# Patient Record
Sex: Female | Born: 1955 | Race: White | Hispanic: No | Marital: Married | Smoking: Current every day smoker
Health system: Southern US, Community
[De-identification: ages and names within clinical notes are randomized; demographics above are authoritative.]

## PROBLEM LIST (undated history)

## (undated) DIAGNOSIS — F329 Major depressive disorder, single episode, unspecified: Secondary | ICD-10-CM

## (undated) DIAGNOSIS — F419 Anxiety disorder, unspecified: Secondary | ICD-10-CM

## (undated) DIAGNOSIS — R569 Unspecified convulsions: Secondary | ICD-10-CM

## (undated) DIAGNOSIS — G2581 Restless legs syndrome: Secondary | ICD-10-CM

## (undated) DIAGNOSIS — G43909 Migraine, unspecified, not intractable, without status migrainosus: Secondary | ICD-10-CM

## (undated) DIAGNOSIS — I10 Essential (primary) hypertension: Secondary | ICD-10-CM

## (undated) DIAGNOSIS — F32A Depression, unspecified: Secondary | ICD-10-CM

## (undated) DIAGNOSIS — G40909 Epilepsy, unspecified, not intractable, without status epilepticus: Secondary | ICD-10-CM

## (undated) DIAGNOSIS — M1990 Unspecified osteoarthritis, unspecified site: Secondary | ICD-10-CM

## (undated) DIAGNOSIS — K219 Gastro-esophageal reflux disease without esophagitis: Secondary | ICD-10-CM

## (undated) DIAGNOSIS — J449 Chronic obstructive pulmonary disease, unspecified: Secondary | ICD-10-CM

## (undated) DIAGNOSIS — R42 Dizziness and giddiness: Secondary | ICD-10-CM

## (undated) DIAGNOSIS — F1220 Cannabis dependence, uncomplicated: Secondary | ICD-10-CM

## (undated) DIAGNOSIS — E669 Obesity, unspecified: Secondary | ICD-10-CM

## (undated) DIAGNOSIS — G4733 Obstructive sleep apnea (adult) (pediatric): Secondary | ICD-10-CM

## (undated) HISTORY — DX: Obstructive sleep apnea (adult) (pediatric): G47.33

## (undated) HISTORY — DX: Depression, unspecified: F32.A

## (undated) HISTORY — DX: Epilepsy, unspecified, not intractable, without status epilepticus: G40.909

## (undated) HISTORY — DX: Restless legs syndrome: G25.81

## (undated) HISTORY — DX: Cannabis dependence, uncomplicated: F12.20

## (undated) HISTORY — DX: Dizziness and giddiness: R42

## (undated) HISTORY — DX: Migraine, unspecified, not intractable, without status migrainosus: G43.909

## (undated) HISTORY — DX: Anxiety disorder, unspecified: F41.9

## (undated) HISTORY — PX: OTHER SURGICAL HISTORY: SHX169

## (undated) HISTORY — DX: Unspecified osteoarthritis, unspecified site: M19.90

## (undated) HISTORY — DX: Obesity, unspecified: E66.9

## (undated) HISTORY — PX: PARTIAL HYSTERECTOMY: SHX80

## (undated) HISTORY — DX: Gastro-esophageal reflux disease without esophagitis: K21.9

## (undated) HISTORY — DX: Unspecified convulsions: R56.9

## (undated) HISTORY — DX: Major depressive disorder, single episode, unspecified: F32.9

---

## 2002-08-30 HISTORY — PX: ROUX-EN-Y PROCEDURE: SUR1287

## 2006-11-14 ENCOUNTER — Emergency Department (HOSPITAL_COMMUNITY): Admission: EM | Admit: 2006-11-14 | Discharge: 2006-11-14 | Payer: Self-pay | Admitting: Emergency Medicine

## 2007-01-22 ENCOUNTER — Emergency Department (HOSPITAL_COMMUNITY): Admission: EM | Admit: 2007-01-22 | Discharge: 2007-01-22 | Payer: Self-pay | Admitting: Emergency Medicine

## 2007-02-13 ENCOUNTER — Emergency Department (HOSPITAL_COMMUNITY): Admission: EM | Admit: 2007-02-13 | Discharge: 2007-02-13 | Payer: Self-pay | Admitting: Emergency Medicine

## 2007-03-09 ENCOUNTER — Emergency Department (HOSPITAL_COMMUNITY): Admission: EM | Admit: 2007-03-09 | Discharge: 2007-03-09 | Payer: Self-pay | Admitting: Emergency Medicine

## 2007-03-09 ENCOUNTER — Ambulatory Visit: Payer: Self-pay | Admitting: Psychiatry

## 2007-03-09 ENCOUNTER — Inpatient Hospital Stay (HOSPITAL_COMMUNITY): Admission: AD | Admit: 2007-03-09 | Discharge: 2007-03-13 | Payer: Self-pay | Admitting: Psychiatry

## 2007-08-25 ENCOUNTER — Emergency Department (HOSPITAL_COMMUNITY): Admission: EM | Admit: 2007-08-25 | Discharge: 2007-08-25 | Payer: Self-pay | Admitting: Emergency Medicine

## 2007-09-26 ENCOUNTER — Emergency Department (HOSPITAL_COMMUNITY): Admission: EM | Admit: 2007-09-26 | Discharge: 2007-09-26 | Payer: Self-pay | Admitting: Emergency Medicine

## 2008-04-16 ENCOUNTER — Ambulatory Visit: Payer: Self-pay | Admitting: Orthopedic Surgery

## 2008-05-05 ENCOUNTER — Emergency Department: Payer: Self-pay | Admitting: Emergency Medicine

## 2008-07-15 ENCOUNTER — Emergency Department: Payer: Self-pay | Admitting: Emergency Medicine

## 2009-02-02 ENCOUNTER — Emergency Department (HOSPITAL_COMMUNITY): Admission: EM | Admit: 2009-02-02 | Discharge: 2009-02-02 | Payer: Self-pay | Admitting: Emergency Medicine

## 2009-04-14 IMAGING — CR DG CHEST 2V
1 series · 2 of 2 positions shown · non-contrast
Comparison: none

REASON FOR EXAM: cough and sob
COMMENTS:

[Series 1: view not recorded · 0.17mm/px · 2 of 2 slices shown]
[im 1/2]
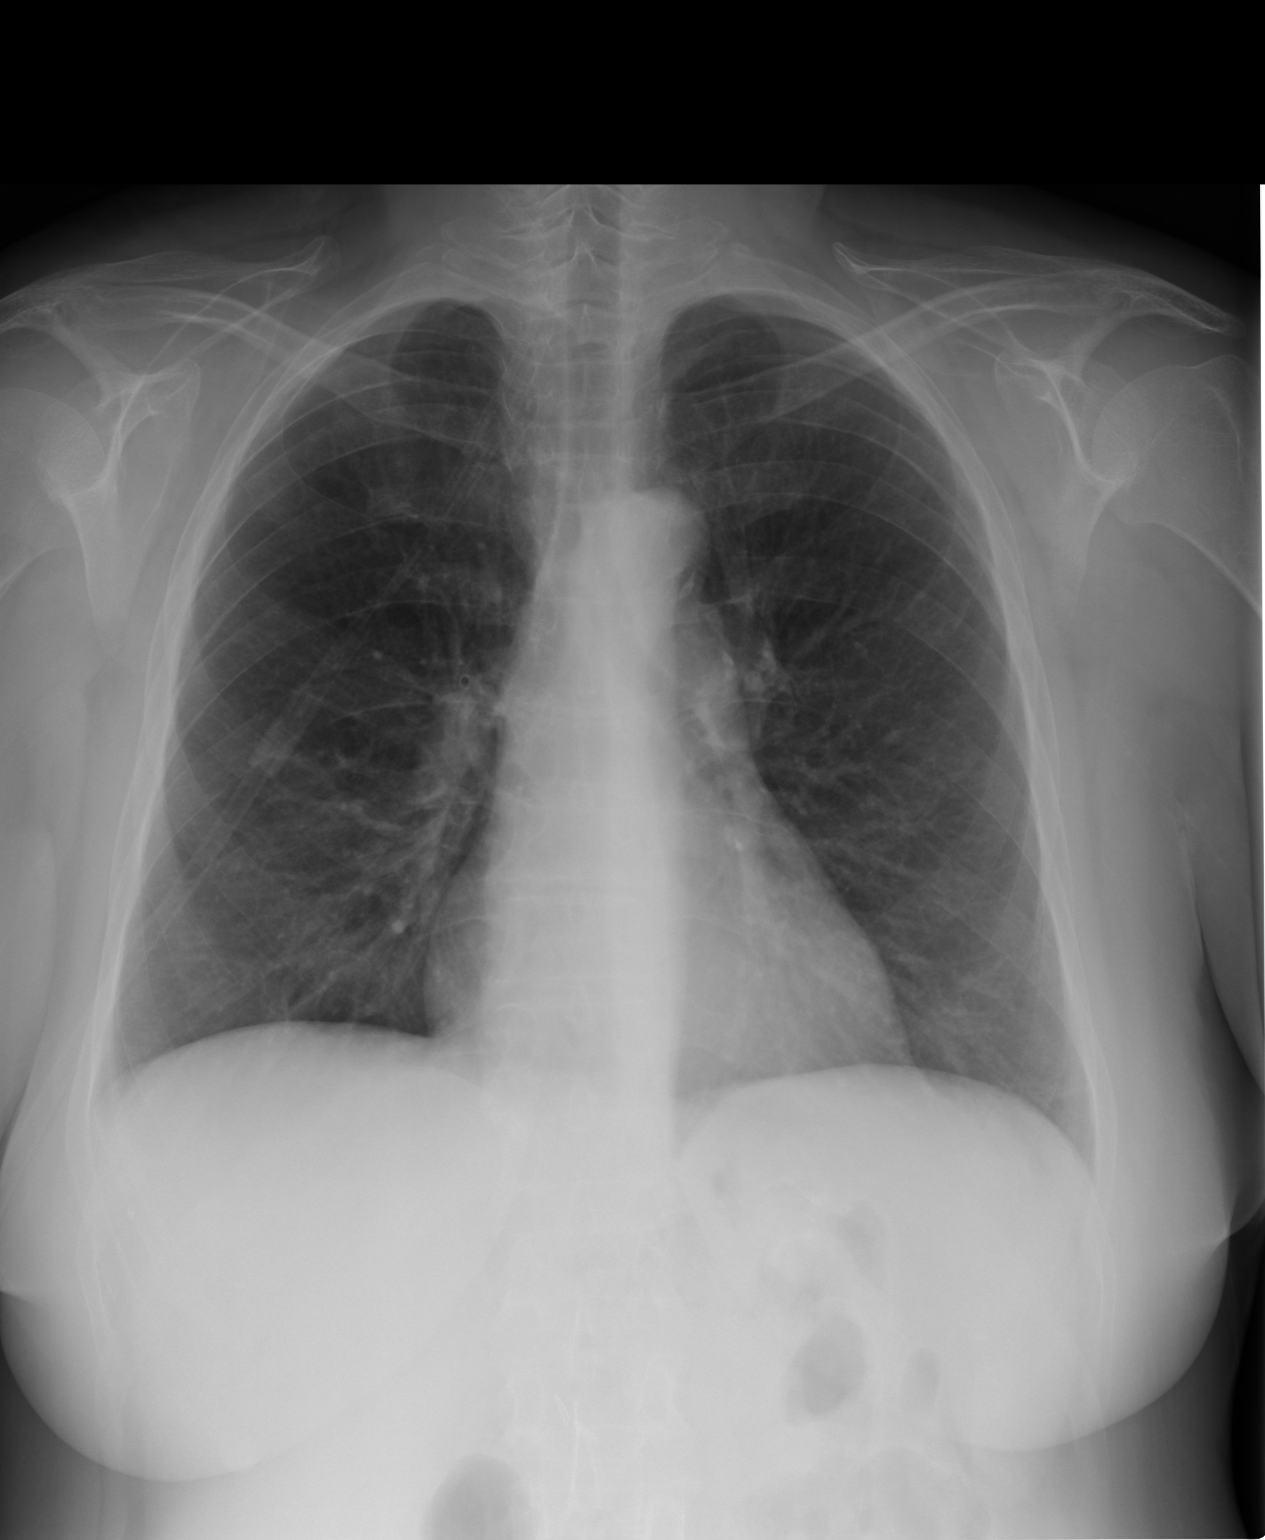
[im 2/2]
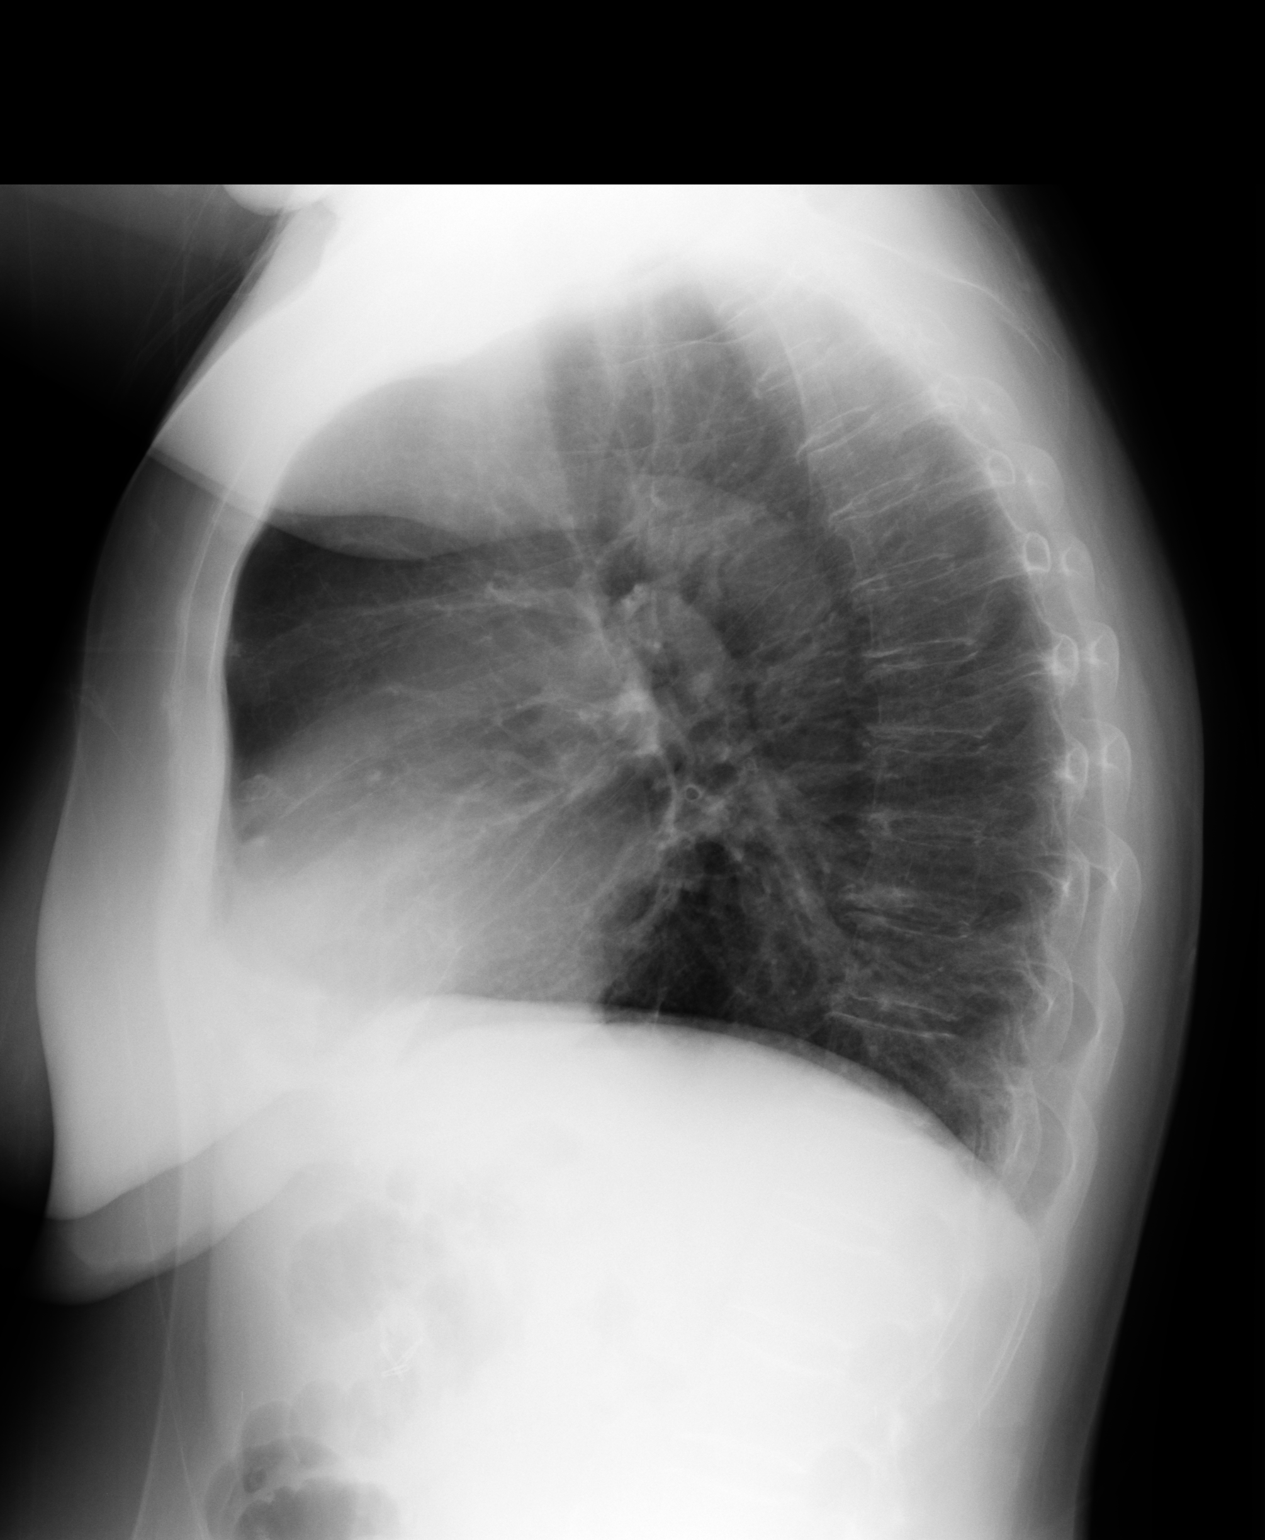

[2 of 2 positions shown; findings below may reference images not displayed]

PROCEDURE:     DXR - DXR CHEST PA (OR AP) AND LATERAL  - May 05, 2008  [DATE]

RESULT:     There is no previous examination for comparison.

The lungs are clear. The heart and pulmonary vessels are normal. The bony
and mediastinal structures are unremarkable. There is no effusion. There is
no pneumothorax or evidence of congestive failure.
IMPRESSION: No acute cardiopulmonary disease.

## 2009-07-03 ENCOUNTER — Ambulatory Visit: Payer: Self-pay | Admitting: Internal Medicine

## 2010-04-10 ENCOUNTER — Observation Stay (HOSPITAL_COMMUNITY): Admission: EM | Admit: 2010-04-10 | Discharge: 2010-04-10 | Payer: Self-pay | Admitting: Emergency Medicine

## 2010-04-15 ENCOUNTER — Emergency Department (HOSPITAL_COMMUNITY): Admission: EM | Admit: 2010-04-15 | Discharge: 2010-04-15 | Payer: Self-pay | Admitting: Emergency Medicine

## 2010-04-30 ENCOUNTER — Ambulatory Visit: Payer: Self-pay | Admitting: Family Medicine

## 2010-05-28 ENCOUNTER — Ambulatory Visit (HOSPITAL_COMMUNITY): Payer: Self-pay | Admitting: Psychiatry

## 2010-10-23 ENCOUNTER — Emergency Department (HOSPITAL_COMMUNITY)
Admission: EM | Admit: 2010-10-23 | Discharge: 2010-10-24 | Disposition: A | Discharge status: Home / Self Care | Payer: Medicare Other | Attending: Emergency Medicine | Admitting: Emergency Medicine

## 2010-10-23 DIAGNOSIS — G40909 Epilepsy, unspecified, not intractable, without status epilepticus: Secondary | ICD-10-CM | POA: Insufficient documentation

## 2010-10-23 DIAGNOSIS — Z79899 Other long term (current) drug therapy: Secondary | ICD-10-CM | POA: Insufficient documentation

## 2010-10-23 DIAGNOSIS — F319 Bipolar disorder, unspecified: Secondary | ICD-10-CM | POA: Insufficient documentation

## 2010-10-23 DIAGNOSIS — G43909 Migraine, unspecified, not intractable, without status migrainosus: Secondary | ICD-10-CM | POA: Insufficient documentation

## 2010-10-23 DIAGNOSIS — Z882 Allergy status to sulfonamides status: Secondary | ICD-10-CM | POA: Insufficient documentation

## 2010-10-23 DIAGNOSIS — J45909 Unspecified asthma, uncomplicated: Secondary | ICD-10-CM | POA: Insufficient documentation

## 2010-10-23 DIAGNOSIS — F17200 Nicotine dependence, unspecified, uncomplicated: Secondary | ICD-10-CM | POA: Insufficient documentation

## 2010-10-23 DIAGNOSIS — K5289 Other specified noninfective gastroenteritis and colitis: Secondary | ICD-10-CM | POA: Insufficient documentation

## 2010-10-23 LAB — BASIC METABOLIC PANEL
BUN: 9 mg/dL (ref 6–23)
Calcium: 9.9 mg/dL (ref 8.4–10.5)
Chloride: 103 mEq/L (ref 96–112)
Potassium: 3.9 mEq/L (ref 3.5–5.1)

## 2010-10-23 LAB — URINALYSIS, ROUTINE W REFLEX MICROSCOPIC
Ketones, ur: >80 mg/dL — AB
Nitrite: NEGATIVE

## 2010-10-23 LAB — URINE MICROSCOPIC-ADD ON

## 2010-10-23 LAB — DIFFERENTIAL
Basophils Relative: 0 % (ref 0–1)
Lymphocytes Relative: 16 % (ref 12–46)
Monocytes Relative: 3 % (ref 3–12)
Neutrophils Relative %: 80 % — ABNORMAL HIGH (ref 43–77)

## 2010-10-23 LAB — CBC
HCT: 45.8 % (ref 36.0–46.0)
Hemoglobin: 15.3 g/dL — ABNORMAL HIGH (ref 12.0–15.0)
MCH: 32 pg (ref 26.0–34.0)
MCHC: 33.4 g/dL (ref 30.0–36.0)
RDW: 12.9 % (ref 11.5–15.5)
WBC: 9.1 10*3/uL (ref 4.0–10.5)

## 2010-11-13 LAB — POCT I-STAT, CHEM 8
BUN: 4 mg/dL — ABNORMAL LOW (ref 6–23)
Chloride: 104 mEq/L (ref 96–112)
Creatinine, Ser: 0.7 mg/dL (ref 0.4–1.2)
HCT: 45 % (ref 36.0–46.0)
Hemoglobin: 15.3 g/dL — ABNORMAL HIGH (ref 12.0–15.0)
Sodium: 138 mEq/L (ref 135–145)

## 2010-11-13 LAB — CBC
HCT: 42.8 % (ref 36.0–46.0)
Hemoglobin: 14.2 g/dL (ref 12.0–15.0)
MCV: 96.8 fL (ref 78.0–100.0)
Platelets: 235 10*3/uL (ref 150–400)
RBC: 4.42 MIL/uL (ref 3.87–5.11)

## 2010-11-13 LAB — DIFFERENTIAL
Basophils Relative: 0 % (ref 0–1)
Eosinophils Relative: 0 % (ref 0–5)
Lymphs Abs: 1.5 10*3/uL (ref 0.7–4.0)
Monocytes Absolute: 0.2 10*3/uL (ref 0.1–1.0)

## 2010-11-18 ENCOUNTER — Encounter (INDEPENDENT_AMBULATORY_CARE_PROVIDER_SITE_OTHER): Payer: Self-pay | Admitting: *Deleted

## 2010-11-18 LAB — CONVERTED CEMR LAB
ALT: <8 units/L (ref 0–35)
AST: 11 units/L (ref 0–37)
Albumin: 4.4 g/dL (ref 3.5–5.2)
Basophils Relative: 0 % (ref 0–1)
CO2: 28 meq/L (ref 19–32)
Calcium: 9.2 mg/dL (ref 8.4–10.5)
Chloride: 101 meq/L (ref 96–112)
Hemoglobin: 13.7 g/dL (ref 12.0–15.0)
MCHC: 32.8 g/dL (ref 30.0–36.0)
MCV: 99.3 fL (ref 78.0–100.0)
Monocytes Relative: 7 % (ref 3–12)
Neutrophils Relative %: 53 % (ref 43–77)
Phenytoin Lvl: 2.9 ug/mL — ABNORMAL LOW (ref 10.0–20.0)
Platelets: 283 10*3/uL (ref 150–400)
RBC: 4.21 M/uL (ref 3.87–5.11)
Sodium: 139 meq/L (ref 135–145)
Total Protein: 6.6 g/dL (ref 6.0–8.3)

## 2010-12-07 LAB — DIFFERENTIAL
Basophils Absolute: 0 10*3/uL (ref 0.0–0.1)
Basophils Relative: 0 % (ref 0–1)
Eosinophils Absolute: 0.2 10*3/uL (ref 0.0–0.7)
Lymphocytes Relative: 22 % (ref 12–46)
Lymphs Abs: 1.7 10*3/uL (ref 0.7–4.0)
Monocytes Relative: 8 % (ref 3–12)
Neutro Abs: 5.2 10*3/uL (ref 1.7–7.7)
Neutrophils Relative %: 68 % (ref 43–77)

## 2010-12-07 LAB — CBC
HCT: 42.5 % (ref 36.0–46.0)
Hemoglobin: 15.1 g/dL — ABNORMAL HIGH (ref 12.0–15.0)
MCHC: 35.5 g/dL (ref 30.0–36.0)
WBC: 7.7 10*3/uL (ref 4.0–10.5)

## 2010-12-07 LAB — COMPREHENSIVE METABOLIC PANEL
ALT: 9 U/L (ref 0–35)
Albumin: 4 g/dL (ref 3.5–5.2)
Alkaline Phosphatase: 103 U/L (ref 39–117)
CO2: 27 mEq/L (ref 19–32)
Calcium: 9 mg/dL (ref 8.4–10.5)
Chloride: 103 mEq/L (ref 96–112)
GFR calc non Af Amer: >60 mL/min (ref >60)
Glucose, Bld: 112 mg/dL — ABNORMAL HIGH (ref 70–99)
Sodium: 138 mEq/L (ref 135–145)
Total Protein: 6.6 g/dL (ref 6.0–8.3)

## 2010-12-07 LAB — URINALYSIS, ROUTINE W REFLEX MICROSCOPIC
Bilirubin Urine: NEGATIVE
Glucose, UA: NEGATIVE mg/dL
Ketones, ur: NEGATIVE mg/dL
Nitrite: NEGATIVE

## 2010-12-07 LAB — URINE MICROSCOPIC-ADD ON

## 2010-12-13 ENCOUNTER — Emergency Department (HOSPITAL_COMMUNITY): Payer: Medicare Other

## 2010-12-13 ENCOUNTER — Encounter (HOSPITAL_COMMUNITY): Payer: Self-pay | Admitting: Radiology

## 2010-12-13 ENCOUNTER — Inpatient Hospital Stay (HOSPITAL_COMMUNITY)
Admission: EM | Admit: 2010-12-13 | Discharge: 2010-12-17 | DRG: 192 | Disposition: A | Discharge status: Home / Self Care | Payer: Medicare Other | Attending: Internal Medicine | Admitting: Internal Medicine

## 2010-12-13 DIAGNOSIS — M7020 Olecranon bursitis, unspecified elbow: Secondary | ICD-10-CM | POA: Diagnosis present

## 2010-12-13 DIAGNOSIS — R0789 Other chest pain: Secondary | ICD-10-CM | POA: Diagnosis present

## 2010-12-13 DIAGNOSIS — Z9884 Bariatric surgery status: Secondary | ICD-10-CM

## 2010-12-13 DIAGNOSIS — T380X5A Adverse effect of glucocorticoids and synthetic analogues, initial encounter: Secondary | ICD-10-CM | POA: Diagnosis present

## 2010-12-13 DIAGNOSIS — G40909 Epilepsy, unspecified, not intractable, without status epilepticus: Secondary | ICD-10-CM | POA: Diagnosis present

## 2010-12-13 DIAGNOSIS — F17200 Nicotine dependence, unspecified, uncomplicated: Secondary | ICD-10-CM | POA: Diagnosis present

## 2010-12-13 DIAGNOSIS — R7309 Other abnormal glucose: Secondary | ICD-10-CM | POA: Diagnosis present

## 2010-12-13 DIAGNOSIS — R112 Nausea with vomiting, unspecified: Secondary | ICD-10-CM | POA: Diagnosis present

## 2010-12-13 DIAGNOSIS — R197 Diarrhea, unspecified: Secondary | ICD-10-CM | POA: Diagnosis present

## 2010-12-13 DIAGNOSIS — A088 Other specified intestinal infections: Secondary | ICD-10-CM | POA: Diagnosis present

## 2010-12-13 DIAGNOSIS — F319 Bipolar disorder, unspecified: Secondary | ICD-10-CM | POA: Diagnosis present

## 2010-12-13 DIAGNOSIS — K219 Gastro-esophageal reflux disease without esophagitis: Secondary | ICD-10-CM | POA: Diagnosis present

## 2010-12-13 DIAGNOSIS — J441 Chronic obstructive pulmonary disease with (acute) exacerbation: Principal | ICD-10-CM | POA: Diagnosis present

## 2010-12-13 DIAGNOSIS — G4733 Obstructive sleep apnea (adult) (pediatric): Secondary | ICD-10-CM | POA: Diagnosis present

## 2010-12-13 HISTORY — DX: Essential (primary) hypertension: I10

## 2010-12-13 HISTORY — DX: Chronic obstructive pulmonary disease, unspecified: J44.9

## 2010-12-13 LAB — POCT CARDIAC MARKERS: Troponin i, poc: <0.05 ng/mL (ref 0.00–0.09)

## 2010-12-13 LAB — COMPREHENSIVE METABOLIC PANEL
AST: 16 U/L (ref 0–37)
Albumin: 4 g/dL (ref 3.5–5.2)
Alkaline Phosphatase: 111 U/L (ref 39–117)
CO2: 27 mEq/L (ref 19–32)
Chloride: 100 mEq/L (ref 96–112)
Creatinine, Ser: 0.62 mg/dL (ref 0.4–1.2)
GFR calc Af Amer: >60 mL/min (ref >60)
GFR calc non Af Amer: >60 mL/min (ref >60)
Potassium: 3.6 mEq/L (ref 3.5–5.1)
Total Bilirubin: 0.5 mg/dL (ref 0.3–1.2)

## 2010-12-13 LAB — CBC: HCT: 43 % (ref 36.0–46.0)

## 2010-12-13 LAB — DIFFERENTIAL
Eosinophils Absolute: 0.2 10*3/uL (ref 0.0–0.7)
Monocytes Absolute: 0.9 10*3/uL (ref 0.1–1.0)
Neutro Abs: 5.2 10*3/uL (ref 1.7–7.7)

## 2010-12-13 MED ORDER — IOHEXOL 300 MG/ML  SOLN
100.0000 mL | Freq: Once | INTRAMUSCULAR | Status: AC | PRN
  Administered 2010-12-13: 100 mL via INTRAVENOUS

## 2010-12-14 LAB — URINALYSIS, ROUTINE W REFLEX MICROSCOPIC
Bilirubin Urine: NEGATIVE
Hgb urine dipstick: NEGATIVE
Nitrite: NEGATIVE
Specific Gravity, Urine: 1.031 — ABNORMAL HIGH (ref 1.005–1.030)
Urobilinogen, UA: 1 mg/dL (ref 0.0–1.0)
pH: 6.5 (ref 5.0–8.0)

## 2010-12-14 LAB — DIFFERENTIAL
Basophils Relative: 0 % (ref 0–1)
Eosinophils Absolute: 0 10*3/uL (ref 0.0–0.7)
Eosinophils Relative: 0 % (ref 0–5)
Lymphs Abs: 0.8 10*3/uL (ref 0.7–4.0)
Monocytes Absolute: 0.1 10*3/uL (ref 0.1–1.0)
Monocytes Relative: 1 % — ABNORMAL LOW (ref 3–12)
Neutrophils Relative %: 89 % — ABNORMAL HIGH (ref 43–77)

## 2010-12-14 LAB — COMPREHENSIVE METABOLIC PANEL
AST: 16 U/L (ref 0–37)
Albumin: 3.3 g/dL — ABNORMAL LOW (ref 3.5–5.2)
BUN: 6 mg/dL (ref 6–23)
Calcium: 8.5 mg/dL (ref 8.4–10.5)
Chloride: 104 mEq/L (ref 96–112)
Creatinine, Ser: 0.48 mg/dL (ref 0.4–1.2)
GFR calc Af Amer: >60 mL/min (ref >60)
Total Bilirubin: 0.4 mg/dL (ref 0.3–1.2)

## 2010-12-14 LAB — CBC
MCH: 31.2 pg (ref 26.0–34.0)
MCHC: 33.4 g/dL (ref 30.0–36.0)
MCV: 93.4 fL (ref 78.0–100.0)
Platelets: 223 10*3/uL (ref 150–400)
RDW: 12.8 % (ref 11.5–15.5)

## 2010-12-16 LAB — GLUCOSE, CAPILLARY
Glucose-Capillary: 95 mg/dL (ref 70–99)
Glucose-Capillary: 111 mg/dL — ABNORMAL HIGH (ref 70–99)

## 2010-12-17 LAB — GLUCOSE, CAPILLARY
Glucose-Capillary: 56 mg/dL — ABNORMAL LOW (ref 70–99)
Glucose-Capillary: 58 mg/dL — ABNORMAL LOW (ref 70–99)
Glucose-Capillary: 70 mg/dL (ref 70–99)

## 2010-12-18 NOTE — Discharge Summary (Signed)
Katherine Beck, Katherine Beck                ACCOUNT NO.:  [id number]  MEDICAL RECORD NO.:  [id number]           PATIENT TYPE:  I  LOCATION:  1431                         FACILITY:  [facility]  PHYSICIAN:  Andreas Blower, MD       DATE OF BIRTH:  01-29-1956  DATE OF ADMISSION:  12/13/2010 DATE OF DISCHARGE:  12/17/2010                              DISCHARGE SUMMARY   PRIMARY CARE PHYSICIAN:  [facility].  DISCHARGE DIAGNOSES: 1. Acute chronic obstructive pulmonary disease exacerbation. 2. Left olecranon bursitis, status post steroid injection. 3. Right-sided chest pain, musculoskeletal. 4. Hyperglycemia due to steroids. 5. History of bipolar disorder. 6. Gastroesophageal reflux disease. 7. History of depression. 8. History of migraines. 9. Diarrhea, resolved, likely viral gastroenteritis. 10.History of seizure disorder, not an active issue at this time. 11.History of sleep apnea. 12.History of vertigo.  DISCHARGE MEDICATIONS: 1. Robitussin DM 100/10 mg per 5 mL every 6 hours as needed for cough. 2. Moxifloxacin 400 mg p.o. daily for 4 more days. 3. Oxycodone 5 mg IR every 6 hours as needed for pain.  The patient     was given a total of 15 tablets. 4. Prednisone 20 mg p.o. daily for 2 days, then 10 mg p.o. daily for 2     days, then 5 mg p.o. daily for 2 days, and discontinue. 5. Advair Diskus 1 puff inhaled twice daily. 6. Albuterol inhaler 1-2 puffs every 4 hours as needed. 7. Dilantin 100 mg p.o. 3 times a day as needed. 8. Meclizine 25 mg p.o. 3 times a day. 9. Meloxicam 15 mg p.o. daily 10.Multivitamin 1 tablet p.o. daily. 11.Naproxen 500 mg p.o. twice daily. 12.Pantoprazole 40 mg p.o. daily 13.Spiriva 18 mcg 1 capsule p.o. daily. 14.Sumatriptan 100 mg p.o. daily. 15.Tramadol 50 mg 3-4 tablets 4 times daily as needed for pain. 16.Trazodone 150 mg daily at bedtime. 17.Venlafaxine XR 150 mg p.o. daily. 18.Vitamin D3 over-the-counter 2 tablets p.o. daily. 19.Zinc over the counter  p.o. daily.  BRIEF ADMITTING HISTORY AND PHYSICAL:  Katherine Beck is a 55 year old Caucasian female with a history of asthma/COPD who presented to the ER complaining of increasing shortness of breath with cough productive sputum.  RADIOLOGY/IMAGING: 1. The patient had portable chest x-ray, which was negative. 2. The patient had CT angiogram of the chest on December 13, 2010, which     shows no evidence for significant pulmonary embolism.  No evidence     for active pulmonary disease.  LABORATORY DATA:  CBC shows a white count of 7.6, hemoglobin 12.7, hematocrit 38.0, platelet count 223.  Electrolytes normal with a creatinine of 0.48.  Liver function tests normal.  Phenytoin level was 12.3.  UA was negative for nitrites and leukocytes.  HOSPITAL COURSE BY PROBLEMS: 1. Acute chronic obstructive pulmonary disease exacerbation.  The     patient was initially started on IV moxifloxacin and IV Solu-     Medrol, during the course of the hospital stay, her antibiotics     were transitioned to p.o. moxifloxacin.  The steroids were     transitioned to p.o. steroids. The patient will continue steroids  for 6 more days and moxifloxacin for 4 more days to complete a     course of antibiotics and steroids.  The patient also had a walking     desaturation done during the course of hospital stay, her walking     desaturation at the end of ambulating was 96%. 2. Left olecranon bursitis.  Orthopedic service was consulted and Dr.     Jerl Santos evaluated the patient and performed steroid injection for     the bursitis.  Her left elbow since then has improved. 3. Right-sided chest pain, likely due to acute chronic obstructive     pulmonary disease exacerbation.  CT angiogram was negative for     pulmonary embolism.  The patient was tender to palpation on the     right side, likely musculoskeletal pain from cough.  The patient     was given cough suppressants and as needed pain medications to help     with  symptom management. 4. Hyperglycemia, likely due to steroids.  The patient initially was     started on insulin; however, on the day of discharge, the patient     had a low blood sugar of 50.  As a result, at the time of     discharge, she will not be sent home on any insulin. 5. History of bipolar disorder and seizure disorder, stable, not an     active issue at this time. 6. Gastroesophageal reflux disease.  Continue the patient on PPI, not     an active issue at this time. 7. History of migraines, stable. 8. Diarrhea, resolved, most likely viral gastroenteritis.  DISPOSITION AND FOLLOWUP:  The patient is to follow up with her primary care physician, Dr. Georganna Skeans with [facility] on February 09, 2011, at 2:30 p.m.  [facility] could consider referral to pulmonary or getting an outpatient pulmonary function tests if warranted.  Time spent on discharge, directly with the patient and coordinating care, was 35 minutes.     Andreas Blower, MD     SR/MEDQ  D:  12/17/2010  T:  12/18/2010  Job:  [id number]  cc:   Melvern Banker Fax: [phone]  Electronically Signed by Wardell Heath Argyle Gustafson  on 12/18/2010 05:21:06 PM

## 2010-12-25 ENCOUNTER — Inpatient Hospital Stay (HOSPITAL_COMMUNITY)
Admission: EM | Admit: 2010-12-25 | Discharge: 2010-12-30 | DRG: 372 | Disposition: A | Discharge status: Home / Self Care | Payer: Medicare Other | Attending: Internal Medicine | Admitting: Internal Medicine

## 2010-12-25 ENCOUNTER — Emergency Department (HOSPITAL_COMMUNITY): Payer: Medicare Other

## 2010-12-25 DIAGNOSIS — E8809 Other disorders of plasma-protein metabolism, not elsewhere classified: Secondary | ICD-10-CM | POA: Diagnosis present

## 2010-12-25 DIAGNOSIS — N390 Urinary tract infection, site not specified: Secondary | ICD-10-CM | POA: Diagnosis present

## 2010-12-25 DIAGNOSIS — G40909 Epilepsy, unspecified, not intractable, without status epilepticus: Secondary | ICD-10-CM | POA: Diagnosis present

## 2010-12-25 DIAGNOSIS — E876 Hypokalemia: Secondary | ICD-10-CM | POA: Diagnosis present

## 2010-12-25 DIAGNOSIS — F411 Generalized anxiety disorder: Secondary | ICD-10-CM | POA: Diagnosis present

## 2010-12-25 DIAGNOSIS — F319 Bipolar disorder, unspecified: Secondary | ICD-10-CM | POA: Diagnosis present

## 2010-12-25 DIAGNOSIS — F17200 Nicotine dependence, unspecified, uncomplicated: Secondary | ICD-10-CM | POA: Diagnosis present

## 2010-12-25 DIAGNOSIS — E871 Hypo-osmolality and hyponatremia: Secondary | ICD-10-CM | POA: Diagnosis present

## 2010-12-25 DIAGNOSIS — G4733 Obstructive sleep apnea (adult) (pediatric): Secondary | ICD-10-CM | POA: Diagnosis present

## 2010-12-25 DIAGNOSIS — A0472 Enterocolitis due to Clostridium difficile, not specified as recurrent: Principal | ICD-10-CM | POA: Diagnosis present

## 2010-12-25 DIAGNOSIS — J4489 Other specified chronic obstructive pulmonary disease: Secondary | ICD-10-CM | POA: Diagnosis present

## 2010-12-25 DIAGNOSIS — J449 Chronic obstructive pulmonary disease, unspecified: Secondary | ICD-10-CM | POA: Diagnosis present

## 2010-12-25 LAB — URINE MICROSCOPIC-ADD ON

## 2010-12-25 LAB — CBC
HCT: 39.4 % (ref 36.0–46.0)
Hemoglobin: 13.7 g/dL (ref 12.0–15.0)
MCH: 31.8 pg (ref 26.0–34.0)
MCHC: 34.8 g/dL (ref 30.0–36.0)
MCV: 91.4 fL (ref 78.0–100.0)
Platelets: 222 K/uL (ref 150–400)
RBC: 4.31 MIL/uL (ref 3.87–5.11)
RDW: 12.7 % (ref 11.5–15.5)
WBC: 15.3 10*3/uL — ABNORMAL HIGH (ref 4.0–10.5)

## 2010-12-25 LAB — URINALYSIS, ROUTINE W REFLEX MICROSCOPIC
Glucose, UA: NEGATIVE mg/dL
Nitrite: NEGATIVE
Protein, ur: 30 mg/dL — AB
Specific Gravity, Urine: 1.024 (ref 1.005–1.030)
Urobilinogen, UA: 0.2 mg/dL (ref 0.0–1.0)
pH: 6 (ref 5.0–8.0)

## 2010-12-25 LAB — COMPREHENSIVE METABOLIC PANEL
CO2: 24 mEq/L (ref 19–32)
Calcium: 8 mg/dL — ABNORMAL LOW (ref 8.4–10.5)
Creatinine, Ser: 0.76 mg/dL (ref 0.4–1.2)
GFR calc non Af Amer: >60 mL/min (ref >60)
Glucose, Bld: 131 mg/dL — ABNORMAL HIGH (ref 70–99)
Total Protein: 5.7 g/dL — ABNORMAL LOW (ref 6.0–8.3)

## 2010-12-25 LAB — COMPREHENSIVE METABOLIC PANEL WITH GFR
ALT: 10 U/L (ref 0–35)
AST: 14 U/L (ref 0–37)
Albumin: 2.9 g/dL — ABNORMAL LOW (ref 3.5–5.2)
Alkaline Phosphatase: 82 U/L (ref 39–117)
BUN: 9 mg/dL (ref 6–23)
Chloride: 96 meq/L (ref 96–112)
GFR calc Af Amer: >60 mL/min (ref >60)
Potassium: 3.2 meq/L — ABNORMAL LOW (ref 3.5–5.1)
Sodium: 132 meq/L — ABNORMAL LOW (ref 135–145)
Total Bilirubin: 0.6 mg/dL (ref 0.3–1.2)

## 2010-12-25 LAB — DIFFERENTIAL
Basophils Absolute: 0 10*3/uL (ref 0.0–0.1)
Basophils Relative: 0 % (ref 0–1)
Eosinophils Absolute: 0 10*3/uL (ref 0.0–0.7)
Eosinophils Relative: 0 % (ref 0–5)
Lymphocytes Relative: 8 % — ABNORMAL LOW (ref 12–46)
Lymphs Abs: 1.2 K/uL (ref 0.7–4.0)
Monocytes Absolute: 1.5 K/uL — ABNORMAL HIGH (ref 0.1–1.0)
Monocytes Relative: 10 % (ref 3–12)
Neutro Abs: 12.6 K/uL — ABNORMAL HIGH (ref 1.7–7.7)
Neutrophils Relative %: 82 % — ABNORMAL HIGH (ref 43–77)
WBC Morphology: INCREASED

## 2010-12-25 LAB — LACTIC ACID, PLASMA: Lactic Acid, Venous: 2.1 mmol/L (ref 0.5–2.2)

## 2010-12-25 LAB — PROCALCITONIN: Procalcitonin: 0.18 ng/mL

## 2010-12-25 LAB — LIPASE, BLOOD: Lipase: 18 U/L (ref 11–59)

## 2010-12-25 MED ORDER — IOHEXOL 300 MG/ML  SOLN: 125.0000 mL | Freq: Once | INTRAMUSCULAR | Status: AC | PRN

## 2010-12-26 LAB — DIFFERENTIAL
Basophils Relative: 0 % (ref 0–1)
Eosinophils Absolute: 0.2 10*3/uL (ref 0.0–0.7)
Eosinophils Relative: 1 % (ref 0–5)
Lymphs Abs: 2 10*3/uL (ref 0.7–4.0)
Monocytes Absolute: 2.3 10*3/uL — ABNORMAL HIGH (ref 0.1–1.0)
WBC Morphology: INCREASED

## 2010-12-26 LAB — COMPREHENSIVE METABOLIC PANEL
BUN: 6 mg/dL (ref 6–23)
Calcium: 7.1 mg/dL — ABNORMAL LOW (ref 8.4–10.5)
Glucose, Bld: 136 mg/dL — ABNORMAL HIGH (ref 70–99)
Total Protein: 4.3 g/dL — ABNORMAL LOW (ref 6.0–8.3)

## 2010-12-26 LAB — MRSA PCR SCREENING: MRSA by PCR: NEGATIVE

## 2010-12-26 LAB — CBC
HCT: 33.2 % — ABNORMAL LOW (ref 36.0–46.0)
MCHC: 33.4 g/dL (ref 30.0–36.0)
MCV: 92 fL (ref 78.0–100.0)
Platelets: 211 10*3/uL (ref 150–400)
RDW: 13 % (ref 11.5–15.5)

## 2010-12-26 LAB — CLOSTRIDIUM DIFFICILE BY PCR

## 2010-12-26 LAB — PREALBUMIN: Prealbumin: 7 mg/dL — ABNORMAL LOW (ref 17.0–34.0)

## 2010-12-26 MED ORDER — IOHEXOL 300 MG/ML  SOLN
125.0000 mL | Freq: Once | INTRAMUSCULAR | Status: AC | PRN
  Administered 2010-12-26: 125 mL via INTRAVENOUS

## 2010-12-27 LAB — URINE CULTURE
Colony Count: NO GROWTH
Culture  Setup Time: 201204280110
Culture: NO GROWTH

## 2010-12-27 LAB — MICROALBUMIN / CREATININE URINE RATIO: Creatinine, Urine: 93.5 mg/dL

## 2010-12-28 LAB — CBC
Hemoglobin: 10.4 g/dL — ABNORMAL LOW (ref 12.0–15.0)
MCH: 30.9 pg (ref 26.0–34.0)
RBC: 3.37 MIL/uL — ABNORMAL LOW (ref 3.87–5.11)

## 2010-12-28 LAB — BASIC METABOLIC PANEL
CO2: 26 mEq/L (ref 19–32)
Chloride: 105 mEq/L (ref 96–112)
Creatinine, Ser: 0.38 mg/dL — ABNORMAL LOW (ref 0.4–1.2)
GFR calc Af Amer: >60 mL/min (ref >60)

## 2010-12-28 LAB — PHENYTOIN LEVEL, TOTAL: Phenytoin Lvl: 8.9 ug/mL — ABNORMAL LOW (ref 10.0–20.0)

## 2010-12-29 LAB — BASIC METABOLIC PANEL
Calcium: 7.8 mg/dL — ABNORMAL LOW (ref 8.4–10.5)
GFR calc Af Amer: >60 mL/min (ref >60)
GFR calc non Af Amer: >60 mL/min (ref >60)
Sodium: 138 mEq/L (ref 135–145)

## 2010-12-29 LAB — CBC
Hemoglobin: 10.2 g/dL — ABNORMAL LOW (ref 12.0–15.0)
MCHC: 33 g/dL (ref 30.0–36.0)
RDW: 13.3 % (ref 11.5–15.5)

## 2010-12-29 NOTE — Discharge Summary (Signed)
NAMEGRACIANNA, VINK                ACCOUNT NO.:  [id number]  MEDICAL RECORD NO.:  [id number]           PATIENT TYPE:  I  LOCATION:  1342                         FACILITY:  [facility]  PHYSICIAN:  Hillery Aldo, M.D.   DATE OF BIRTH:  03-Feb-1956  DATE OF ADMISSION:  12/25/2010 DATE OF DISCHARGE:                              DISCHARGE SUMMARY   PRIMARY CARE PHYSICIAN:  Dineen Kid. Reche Dixon, M.D. at [facility].  CURRENT DIAGNOSES: 1. Clostridium difficile colitis. 2. Hyponatremia. 3. Hypokalemia. 4. Hypoalbuminemia. 5. Proteinuria. 6. Chronic obstructive pulmonary disease. 7. Seizure disorder. 8. History of bipolar disorder. 9. Gastroesophageal reflux disease. 10.4-cm left ovarian cyst, nonemergent, outpatient followup     recommended. 11.Normocytic anemia.  DISCHARGE MEDICATIONS:  Will be dictated at time of actual discharge.  CONSULTATIONS:  None.  BRIEF ADMISSION HISTORY OF PRESENT ILLNESS:  The patient is a 55 year old female with a past medical history of chronic obstructive pulmonary disease and recent treatment with antibiotics for bronchitis, who presented to the hospital with nausea, vomiting, abdominal pain, and diarrhea.  Upon initial evaluation in the emergency department, the patient had a fever of 101.3 and leukocytosis with a white blood cell count of 15.3.  A CT scan of the abdomen and pelvis was subsequently performed which showed pancolitis and she was referred to the hospitalist service for inpatient evaluation and treatment of suspected Clostridium difficile colitis.  For full details, please see the dictated report done by Dr. Lovell Sheehan.  PROCEDURES AND DIAGNOSTIC STUDIES: 1. Chest x-ray on December 25, 2010 showed no active lung disease. 2. CT scan of the abdomen and pelvis on December 26, 2010 showed diffuse     bowel wall thickening involving the entire colon consistent with     pancolitis.  Distended gallbladder without secondary signs of acute  cholecystitis.  4-cm cyst associated with the left ovary.  A     nonemergent pelvic ultrasound for further evaluation and to serve     as a baseline for followup recommended.  DISCHARGE LABORATORY VALUES:  Will be dictated at time of actual discharge.  HOSPITAL COURSE BY PROBLEM: 1. Clostridium difficile colitis:  The patient was admitted with a     high suspicion of Clostridium difficile colitis and subsequent     testing confirmed this.  She is now on day 4 of oral vancomycin     with improvement in her symptoms.  Although she is still having     multiple loose stools, her white blood cell count has normalized,     she is afebrile, and her clinical symptoms have improved. 2. Mild hyponatremia:  This is completely resolved. 3. Hypokalemia:  The patient's potassium is fully repleted. 4. Hypoalbuminemia:  This is felt to be secondary to a combination of     malnourishment, (the patient has a history of gastric bypass), and     inflammation from Clostridium difficile.  She was seen by the     dietitian and recommendations were followed. 5. Proteinuria:  The patient does have proteinuria of unclear     etiology.  She does have also a mild elevation of her  microalbumin     to creatinine ratio.  We recommend outpatient followup. 6. Chronic obstructive pulmonary disease:  The patient's respiratory     status has been stable in the hospital. 7. Seizure disorder:  The patient has been maintained on Dilantin per     pharmacy dosing and has not had any events. 8. Bipolar disorder:  The patient's mood and affect have been stable. 9. Gastroesophageal reflux disease:  Attempts to discontinue PPI use     were made due to the patient's Clostridium difficile infection.     The patient became symptomatic with increased abdominal pain and     the PPI was resumed at daily dosing rather than b.i.d. dosing.  At     this point, she seems to have her symptoms under control with daily      dosing. 10.Normocytic anemia, the patient's anemia is mild and can be worked     up further as an outpatient if deemed appropriate.  DISPOSITION:  The patient is approaching medical stability for discharge.  PLAN:  Plan is to normal saline IV to ensure that she is able to maintain her hydration status prior to discharge and if able to do so, she can likely be discharged in the morning.     Hillery Aldo, M.D.     CR/MEDQ  D:  12/29/2010  T:  12/29/2010  Job:  [id number]  cc:   Dala Dock  Electronically Signed by Hillery Aldo M.D. on 12/29/2010 06:30:29 PM

## 2010-12-30 LAB — STOOL CULTURE

## 2010-12-30 LAB — BASIC METABOLIC PANEL
Potassium: 3.8 mEq/L (ref 3.5–5.1)
Sodium: 138 mEq/L (ref 135–145)

## 2010-12-30 NOTE — H&P (Signed)
NAMEJUDEA, Katherine Beck                ACCOUNT NO.:  [id number]  MEDICAL RECORD NO.:  [id number]           PATIENT TYPE:  I  LOCATION:  1431                         FACILITY:  [facility]  PHYSICIAN:  Massie Maroon, MD        DATE OF BIRTH:  [birth year]  DATE OF ADMISSION:  12/13/2010 DATE OF DISCHARGE:                             HISTORY & PHYSICAL   CHIEF COMPLAINT:  "I have had a cold for few days and shortness of breath."  HISTORY OF PRESENT ILLNESS:  A 55 year old female with a history of asthma/COPD,  apparently complains of increasing shortness of breath over the last few days.  She was severely short of breath today,  so presented to the ER.  She notes that she has had some nausea, vomiting (no blood) as well as some loose stool, 3 to 4 times today.  The patient denies any fever, chills, abdominal pain, constipation, bright red blood per rectum or black stool. The patient also denies any dysuria or hematuria.  The patient had a chest x-ray in the ER that was negative. She had a CT scan of the chest that was negative for pulmonary embolus, and no evidence of active pulmonary disease.  Her BNP was less than 30. Kidney function and liver functions were within normal limits as well as cardiac markers.  The patient will be admitted for COPD exacerbation as well as possible gastroenteritis.  PAST MEDICAL HISTORY: 1. Asthma/chronic obstructive pulmonary disease (not on home O2, never     intubated). 2. Bipolar disorder. 3. Depression. 4. Seizure disorder. 5. Migraines. 6. Sleep apnea (previously on CPAP but not presently). 7. Vertigo.  PAST SURGICAL HISTORY: 1. Bilateral heel surgery for heel spurs. 2. Bladder tack. 3. Gastric bypass. 4. Hysterectomy.  SOCIAL HISTORY:  The patient is a current smoker, smokes one-pack per day times 39 years.  She denies any alcohol use.  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  The patient is married.  FAMILY HISTORY:  Mother is alive at age 84  and has depression and thyroid and sugar problems.  Her father died at age 53 had some type of blood disorders well as well as a heart attack and was a smoker.  ALLERGIES:  SULFA.  MEDICATIONS: 1. Trazodone 150 mg p.o. at bedtime 2. Naproxen 500 mg p.o. b.i.d. 3. Tramadol 50 mg p.o. t.i.d. 4. Effexor 150 mg p.o. daily. 5. Protonix 40 mg p.o. daily. 6. Dilantin 100 mg p.o. q.i.d. 7. Meloxicam 15 mg p.o. daily. 8. Advair Diskus 1 puff daily. 9. Albuterol. 10.Spiriva. 11.Meclizine.  REVIEW OF SYSTEMS:  Negative for all 10 organ systems, except for pertinent positives stated above.  PHYSICAL EXAM:  VITAL SIGNS:  Temperature 98.7, pulse 83, blood pressure 119/78, pulse ox 95% on room air. HEENT: Anicteric. NECK: No JVD. HEART:  Regular rate and rhythm.  S1, S2, no murmurs, gallops or rubs. LUNGS:  Positive bilateral expiratory wheezes, no crackles. ABDOMEN:  Soft, flat, nontender, nondistended.  Positive bowel sounds. EXTREMITIES:  No cyanosis, clubbing or edema. SKIN:  Positive multiple tattoos. LYMPHATICS:  Lymph nodes no adenopathy. NEUROLOGICAL:  Nonfocal.  LABORATORIES:  WBC 11.5, hemoglobin 14.8, platelet count is 307, troponin-I less than 0.05.  Sodium 137, potassium 3.6, BUN 7, creatinine 0.62, AST 16, ALT 11, BNP less than 30.  CT angio chest negative for pulmonary embolus, no active pulmonary disease.  Chest x-ray negative.  ASSESSMENT/PLAN: 1. COPD exacerbation, treated with Solu-Medrol 80 mg IV q. 8 along     with Avelox 40 mg IV daily along with Spiriva 1 puff daily and     Advair discus 250/50 one puff b.i.d. and Xopenex nebs. 2. Nausea, vomiting, diarrhea likely viral gastroenteritis:  Increase     Protonix to 40 mg p.o. b.i.d. and treat nausea, vomiting     symptomatically with Phenergan and Zofran.  Diarrhea can be treated     with Imodium 2 mg p.o. daily p.r.n. 3. Diarrhea:  Check stool for fecal leukocytes, culture C diff.     Consider further workup with  a GI physician. 4. Seizure disorder.  Continue Dilantin.  Check a Dilantin level. 5. Bipolar/depression:  Continue trazodone, Effexor. 6. Arthritis:  Discontinue naproxen and Meloxicam as they are     redundant. 7. Obstructive sleep apnea:  Please obtain a consult as an outpatient     for a sleep study to titrate CPAP. 8. DVT prophylaxis, SCDs,  tobacco use:  Before DVT prophylaxis,     nicotine patch 21 mg topically daily as needed.     Massie Maroon, MD     JYK/MEDQ  D:  12/14/2010  T:  12/14/2010  Job:  [id number]  cc:   Melvern Banker Fax: [phone]  Electronically Signed by Pearson Grippe MD on 12/30/2010 11:35:27 PM

## 2011-01-01 LAB — CULTURE, BLOOD (ROUTINE X 2)
Culture  Setup Time: 201204280236
Culture  Setup Time: 201204280237
Culture: NO GROWTH
Culture: NO GROWTH

## 2011-01-07 NOTE — Discharge Summary (Signed)
  [name], Katherine Beck                ACCOUNT NO.:  [id number]  MEDICAL RECORD NO.:  [id number]           PATIENT TYPE:  I  LOCATION:  1342                         FACILITY:  [facility]  PHYSICIAN:  Rock Nephew, MD       DATE OF BIRTH:  November 25, 1955  DATE OF ADMISSION:  12/25/2010 DATE OF DISCHARGE:                        DISCHARGE SUMMARY - REFERRING   ADDENDUM: This is an addendum to the discharge dictation summary dictated by Dr. Hillery Aldo on Dec 29, 2010.  This is an addendum to job number [id number].  DISCHARGE DIAGNOSES:  Please refer to the discharge dictation dictated by Dr. Darnelle Catalan.  DISCHARGE MEDICATIONS:  The patient's discharge medications are as follows: 1. Florastor 250 mg p.o. b.i.d. for 14 days. 2. Vicodin 1 tablet by mouth every 6 hours as needed. 3. Vancomycin 125 mg by mouth 4 times a day, take for 10 days. 4. Zofran 4 mg by mouth every 6 hours as needed. 5. Dilantin extended release 100 mg 1 capsule by mouth 3 times a day. 6. Advair Diskus 50/500 one puff inhaled twice daily. 7. Albuterol 1 to 2 puffs inhaled every 4 hours as needed. 8. Aleve 220 mg 1 to 2 tablets by mouth twice daily as needed. 9. Meclizine 25 mg 1 tablet by mouth 3 times a day. 10.Meloxicam 15 mg 1 tablet by mouth daily. 11.Multivitamins 1 tablet p.o. daily. 12.Naproxen 500 mg 1 tablet by mouth twice daily. 13.Protonix 40 mg 1 tablet by mouth p.o. daily. 14.Spiriva 18 mcg 1 capsule inhaled daily. 15.Sumatriptan 100 mg 1 tablet by mouth daily as needed. 16.Tramadol 50 mg 1 tablet by mouth 3 times a day as needed. 17.Trazodone 150 mg 1 tablet p.o. daily at bedtime. 18.Venlafaxine XR 150 mg 1 capsule by mouth daily. 19.Vitamin D3 over-the-counter 2 tablets by mouth daily. 20.Spiriva 18 mcg inhaled daily. 21.Zinc over-the-counter 1 tablet by mouth daily.  DISPOSITION:  The patient is discharged home.  The patient does not need PT, OT, or a rolling walker.  FOLLOWUP:  The patient should follow up  with the following physicians: The patient should follow up with [facility] in 2 to 4 weeks.  She has an appointment on February 09, 2011.  The patient should also schedule outpatient ultrasound of left ovaries for cyst found incidentally on her CT scan.  The patient should also recheck urine microalbumin level.  The patient should eat small frequent meals and she should drink plenty of fluids.     Rock Nephew, MD     NH/MEDQ  D:  12/30/2010  T:  12/30/2010  Job:  [id number]  Electronically Signed by Rock Nephew MD on 01/07/2011 09:48:27 PM

## 2011-01-09 NOTE — H&P (Signed)
Katherine Beck, Katherine Beck                ACCOUNT NO.:  [id number]  MEDICAL RECORD NO.:  [id number]           PATIENT TYPE:  I  LOCATION:  1232                         FACILITY:  [facility]  PHYSICIAN:  Della Goo, M.D. DATE OF BIRTH:  1955/10/03  DATE OF ADMISSION:  12/25/2010 DATE OF DISCHARGE:                             HISTORY & PHYSICAL   PRIMARY CARE PHYSICIAN:  [facility]  CHIEF COMPLAINTS:  Nausea, vomiting, diarrhea and abdominal pain.  HISTORY OF PRESENT ILLNESS:  This is a 55 year old female with multiple medical problems who presents with complaints of severe nausea, vomiting and diarrhea over the past 3-4 days since her hospitalization and discharge 1 week ago.  She was hospitalized at that time for an asthma exacerbation and bronchitis and was discharged to home on Avelox therapy for 4 additional days.  The patient reports beginning to have nausea, vomiting and diarrhea of 3 days after her discharge.  She denies having any hematemesis or melena or hematochezia.  She also denies having any fevers or chills.  However, in the emergency department, she was found to have a fever of 101.3 on arrival.  The patient was evaluated in the emergency department.  Her laboratory studies did return revealing a leukocytosis at 15.3 with 82% neutrophils, mild hypokalemia at 3.2 and a CT scan of the abdomen and pelvis was also performed, which did reveal pancolitis.  The patient was referred for medical admission with a high suspicion of possible C difficile colitis.  PAST MEDICAL HISTORY:  Significant for: 1. COPD/asthma. 2. Seizure disorder. 3. Obstructive sleep apnea. 4. Bipolar disorder. 5. Anxiety. 6. Depression. 7. Migraine headaches. 8. The patient has bursitis of the left shoulder and also has a     history of vertigo.  PAST SURGICAL HISTORY: 1. History of a gastric bypass. 2. Hysterectomy. 3. Bladder lift. 4. Bilateral fasciotomies secondary to plantar fasciitis. 5.  Left wrist ganglion cyst excision.  MEDICATIONS:  Discharge medications following her hospitalization from December 13, 2010, to December 17, 2010: 1. Avelox 400 mg 1 p.o. daily for 4 days, which has been completed. 2. Oxycodone 5 mg IR q.6 hours p.r.n. pain, total of 15 tablets given. 3. Prednisone taper beginning at 20 mg p.o. daily for 2 days, then 10     mg p.o. daily for 2 days, then 5 mg for 2 days and then     discontinue. 4. Advair Diskus 1 puff q.12 hours. 5. Albuterol inhaler 1-2 puffs q.4 hours p.r.n. 6. Dilantin 100 mg p.o. 3 times a day. 7. Meclizine 25 mg p.o. 3 times a day. 8. Meloxicam 15 mg 1 p.o. daily. 9. Multivitamin 1 p.o. daily. 10.Naprosyn 500 mg p.o. b.i.d. 11.Protonix 40 mg p.o. daily. 12.Spiriva HandiHaler 18 mcg 1 inhalation daily. 13.Sumatriptan 100 mg p.o. daily p.r.n. migraine headaches. 14.Tramadol 50 mg 3-4 tablets p.o. q.4 hours p.r.n. pain. 15.Trazodone 150 mg p.o. q.h.s. 16.Effexor XR 150 mg p.o. daily. 17.Vitamin D3 over-the-counter 2 tablets p.o. daily. 18.Zinc over-the-counter 1 tablet p.o. daily.  ALLERGIES:  SULFA.  SOCIAL HISTORY:  The patient is indigent.  She works seasonally and is married.  She and  her husband work seasonally in [employer].  She is a smoker, smokes 1 pack of cigarettes daily for 40 years.  She denies alcohol usage.  She does report cannabis usage at times.  FAMILY HISTORY:  Positive for coronary artery disease in her father who died at age 74.  Her mother is alive at age 83 with thyroid disease and diabetes.  REVIEW OF SYSTEMS:  Pertinents mentioned above.  PHYSICAL EXAMINATION:  GENERAL:  This is a 55 year old sickly appearing, overweight well-developed Caucasian female who is in discomfort, but no acute distress currently. VITAL SIGNS: Temperature 101.3, blood pressure 103/64, heart rate 99, respirations 95% to 97%. HEENT:  Normocephalic, atraumatic.  Pupils equally round, reactive to light.  Extraocular movements  are intact.  Funduscopic benign.  There is no scleral icterus.  Nares are patent bilaterally.  Oropharynx is clear. Mucosa is dry. NECK:  Supple.  Full range of motion.  No thyromegaly, adenopathy or jugular venous distention. CARDIOVASCULAR:  Regular rate and rhythm.  Normal S1 and S2.  No murmurs, gallops or rubs appreciated. LUNGS:  Clear to auscultation bilaterally.  No rales, rhonchi or wheezes. ABDOMEN:  Positive bowel sounds, but decreased, tender diffusely, but there is no rebound or guarding.  There is no hepatosplenomegaly. EXTREMITIES:  Without cyanosis, clubbing or edema. SKIN:  Multiple tattoos x24 all over body.  No ulcers or lesions present. NEUROLOGIC:  Generalized weakness, but otherwise, there are no focal deficits.  LABORATORY STUDIES:  White blood cell count 15.3, hemoglobin 13.7, hematocrit 39.4, platelets 222, neutrophils 82%.  Sodium 132, potassium 3.2, chloride 96, CO2 of 24, BUN 9, creatinine 0.76, glucose 131, lipase 18.  Procalcitonin 0.18, lactic acid level 2.2.  Dilantin level 16.6. Urinalysis reveals moderate hemoglobin and trace leukocyte esterase and CT scan of the abdomen and pelvis as mentioned above in the HPI.  ASSESSMENT:  This is a 55 year old female being admitted with: 1. Pancolitis most likely secondary to Clostridium difficile     infection. 2. Nausea, vomiting, diarrhea secondary to pancolitis most likely     secondary to Clostridium difficile infection.. 3. Abdominal pain secondary to pancolitis most likely secondary to     Clostridium difficile infection. 4. Urinary tract infection. 5. Mild hypokalemia. 6. Mild hyponatremia. 7. Chronic obstructive pulmonary disease history. 8. Seizure disorder. 9. History of anxiety, depression and bipolar disorder. 10.Tobacco abuse.  PLAN:  The patient will be admitted.  Her condition is guarded at this time.  Her blood pressure has decreased and the patient is showing signs of early sepsis.  The  patient will be admitted to the step-down ICU area.  She has been placed on IV fluids for fluid resuscitation and the C difficile protocol will be initiated.  Oral vancomycin therapy has been ordered along with IV metronidazole therapy at this time and the patient will also be placed on antibiotic therapy for urinary tract infection with ciprofloxacin.  The patient will be placed in contact isolation for this process until it has been ruled out.  The patient's regular medications have been reviewed and the majority of the medications the patient did not obtain secondary to the cost.  Her Dilantin therapy will be continued and will be made IV at this time secondary to her GI symptoms.  Her Dilantin level was within the therapeutic range and it is evident that the patient did continue with her Dilantin medication.  A nutrition consultation will be placed secondary to the patient's history of a gastric bypass and malnutrition. Also, a social  work consultation will be placed secondary to the patient's lassitude of medical problems and lack of affordability and issues with continuation of her medical care.  DVT prophylaxis has been ordered and the patient is a full code.     Della Goo, M.D.     HJ/MEDQ  D:  12/26/2010  T:  12/26/2010  Job:  [id number]  Electronically Signed by Della Goo M.D. on 01/09/2011 07:48:14 PM

## 2011-01-15 NOTE — Discharge Summary (Signed)
NAMEGISSEL, KEILMAN                ACCOUNT NO.:  [id number]   MEDICAL RECORD NO.:  [id number]          PATIENT TYPE:  IPS   LOCATION:  0504                          FACILITY:  [facility]   PHYSICIAN:  Geoffery Lyons, M.D.      DATE OF BIRTH:  Jan 14, 1956   DATE OF ADMISSION:  03/09/2007  DATE OF DISCHARGE:  03/13/2007                               DISCHARGE SUMMARY   CHIEF COMPLAINT AND PRESENT ILLNESS:  This was the first admission to  [facility] for this 55 year old white female, married,  voluntarily admitted.  One to two weeks of suicidal thoughts after  failed move, crying spells daily for two weeks, had moved from  [place], [place].  Unable to secure employment, chronic conflict  with the daughter, __________, since unemployed, cannot afford  medications.   PAST PSYCHIATRIC HISTORY:  First time at [facility].  Last time  hospitalized in  last year for suicidal thoughts.  Did well  on Effexor XR.   ALCOHOL/DRUG HISTORY:  Denies active use of any substances.   MEDICAL HISTORY:  Epilepsy, migraine headaches, asthma.  Last seizure  April of 2008.   MEDICATIONS:  Effexor XR 225 mg per day, trazodone 100 mg to 150 mg at  bedtime and Topamax and Advair.  Not taking any medications as she could  not afford them.   PHYSICAL EXAMINATION:  Performed and failed to show any acute findings.   LABORATORY DATA:  Results not available in the chart.   MENTAL STATUS EXAM:  Fully alert, pleasant, cooperative female but  persistently crying but able to engage in conversation.  Speech normal  rate, tempo and production.  Mood depressed, hopeless, helpless.  Affect  depressed, tearful.  Thought processes with suicidal rumination without  a plan.  Feeling overwhelmed.  No delusions.  No homicidal ideas.  No  hallucinations.  Cognition well-preserved.   ADMISSION DIAGNOSES:  AXIS I:  Major depression, recurrent.  AXIS II:  No diagnosis.  AXIS III:  Asthma,  epilepsy, history of gastric bypass.  AXIS IV:  Moderate.  AXIS V:  GAF upon admission 30; highest GAF in the last year 65.   HOSPITAL COURSE:  She was admitted.  She was started in individual and  group psychotherapy.  She was placed back on her medications, Effexor XR  started at 37.5 mg per day, trazodone 100 mg at bedtime, albuterol 2  puffs every four hours as needed, Advair 500/50 mg, 1 puff twice a day.  As already stated, moved from [place], living with the daughter, did  not do well, then found a house.  Husband came and now he does not have  a job, has not been able to get a job.  Had tried to cut herself.  Feeling overwhelmed.  Unable to afford her medications.  Will resume the  medication.  Work on Pharmacologist.  By March 11, 2007, continued to be  tearful, depressed about the daughter, who is calling her a bad mother.  Feeling hopeless, helpless, feeling that she could not do anything  right, unable to afford medications.  By March 12, 2007, there was  evidence of improvement, less tearful, more stable.  The daughter did  come by.  She was very encouraged by the fact that the daughter had  visited and they have reconciled.  Daughter was apologetic.  She  remained fragile but better.  We continued to stabilize on medications.  On March 13, 2007, she was in full contact with reality, not crying.  Affect was brighter.  No suicidal thoughts.  The relationship with the  daughter had been mended.  So overall she felt she was ready to go home.  Willing and motivated to pursue outpatient treatment.   DISCHARGE DIAGNOSES:  AXIS I:  Major depression, recurrent.  AXIS II:  No diagnosis.  AXIS III:  Chronic obstructive pulmonary disease, vertigo,  osteoarthritis, headaches.  AXIS IV:  Moderate.  AXIS V:  GAF upon discharge 60.   DISCHARGE MEDICATIONS:  1. Effexor XR 37.5 mg, to increase up to 75 mg per day.  2. Trazodone 100 mg at bedtime.  3. Meclizine 25 mg, 1 up to three times  a day for vertigo.  4. Topamax 25 mg, 1 daily.  5. Dilantin 100 mg, 1 three times a day.  6. Advair Diskus 500/50 mg, 1 puff twice a day.  7. Albuterol 2 puffs as needed for asthma.  8. Singulair 10 mg per day.   FOLLOWUP:  [facility] for medical needs and follow up at [facility].      Geoffery Lyons, M.D.  Electronically Signed     IL/MEDQ  D:  04/10/2007  T:  04/11/2007  Job:  [id number]

## 2011-01-29 ENCOUNTER — Emergency Department (HOSPITAL_COMMUNITY)
Admission: EM | Admit: 2011-01-29 | Discharge: 2011-01-29 | Disposition: A | Discharge status: Home / Self Care | Payer: Medicare Other | Attending: Emergency Medicine | Admitting: Emergency Medicine

## 2011-01-29 DIAGNOSIS — R1033 Periumbilical pain: Secondary | ICD-10-CM | POA: Insufficient documentation

## 2011-01-29 DIAGNOSIS — A0472 Enterocolitis due to Clostridium difficile, not specified as recurrent: Secondary | ICD-10-CM | POA: Insufficient documentation

## 2011-01-29 LAB — URINALYSIS, ROUTINE W REFLEX MICROSCOPIC
Glucose, UA: NEGATIVE mg/dL
Leukocytes, UA: NEGATIVE
Protein, ur: NEGATIVE mg/dL
Specific Gravity, Urine: 1.006 (ref 1.005–1.030)
Urobilinogen, UA: 0.2 mg/dL (ref 0.0–1.0)

## 2011-01-29 LAB — COMPREHENSIVE METABOLIC PANEL
AST: 11 U/L (ref 0–37)
Albumin: 3.3 g/dL — ABNORMAL LOW (ref 3.5–5.2)
BUN: 4 mg/dL — ABNORMAL LOW (ref 6–23)
Calcium: 8.8 mg/dL (ref 8.4–10.5)
Creatinine, Ser: 0.47 mg/dL (ref 0.4–1.2)
GFR calc Af Amer: >60 mL/min (ref >60)
GFR calc non Af Amer: >60 mL/min (ref >60)
Total Bilirubin: 0.2 mg/dL — ABNORMAL LOW (ref 0.3–1.2)

## 2011-01-29 LAB — DIFFERENTIAL
Eosinophils Absolute: 0.1 10*3/uL (ref 0.0–0.7)
Lymphocytes Relative: 26 % (ref 12–46)
Lymphs Abs: 2.6 10*3/uL (ref 0.7–4.0)
Neutrophils Relative %: 62 % (ref 43–77)

## 2011-01-29 LAB — CBC
MCH: 32.1 pg (ref 26.0–34.0)
MCHC: 33.5 g/dL (ref 30.0–36.0)
MCV: 95.9 fL (ref 78.0–100.0)
Platelets: 298 10*3/uL (ref 150–400)

## 2011-01-29 LAB — URINE MICROSCOPIC-ADD ON

## 2011-02-01 ENCOUNTER — Inpatient Hospital Stay (HOSPITAL_COMMUNITY)
Admission: EM | Admit: 2011-02-01 | Discharge: 2011-02-08 | DRG: 372 | Disposition: A | Discharge status: Home / Self Care | Payer: Medicare Other | Attending: Internal Medicine | Admitting: Internal Medicine

## 2011-02-01 DIAGNOSIS — D649 Anemia, unspecified: Secondary | ICD-10-CM | POA: Diagnosis present

## 2011-02-01 DIAGNOSIS — Z9884 Bariatric surgery status: Secondary | ICD-10-CM

## 2011-02-01 DIAGNOSIS — R1013 Epigastric pain: Secondary | ICD-10-CM | POA: Diagnosis present

## 2011-02-01 DIAGNOSIS — I959 Hypotension, unspecified: Secondary | ICD-10-CM | POA: Diagnosis present

## 2011-02-01 DIAGNOSIS — F341 Dysthymic disorder: Secondary | ICD-10-CM | POA: Diagnosis present

## 2011-02-01 DIAGNOSIS — R112 Nausea with vomiting, unspecified: Secondary | ICD-10-CM | POA: Diagnosis present

## 2011-02-01 DIAGNOSIS — H1089 Other conjunctivitis: Secondary | ICD-10-CM | POA: Diagnosis present

## 2011-02-01 DIAGNOSIS — J4489 Other specified chronic obstructive pulmonary disease: Secondary | ICD-10-CM | POA: Diagnosis present

## 2011-02-01 DIAGNOSIS — A0472 Enterocolitis due to Clostridium difficile, not specified as recurrent: Principal | ICD-10-CM | POA: Diagnosis present

## 2011-02-01 DIAGNOSIS — E46 Unspecified protein-calorie malnutrition: Secondary | ICD-10-CM | POA: Diagnosis present

## 2011-02-01 DIAGNOSIS — K219 Gastro-esophageal reflux disease without esophagitis: Secondary | ICD-10-CM | POA: Diagnosis present

## 2011-02-01 DIAGNOSIS — J449 Chronic obstructive pulmonary disease, unspecified: Secondary | ICD-10-CM | POA: Diagnosis present

## 2011-02-01 LAB — CBC
MCH: 31.2 pg (ref 26.0–34.0)
MCHC: 33.2 g/dL (ref 30.0–36.0)
MCV: 94.1 fL (ref 78.0–100.0)
Platelets: 352 10*3/uL (ref 150–400)
RDW: 13.6 % (ref 11.5–15.5)

## 2011-02-01 LAB — URINALYSIS, ROUTINE W REFLEX MICROSCOPIC
Bilirubin Urine: NEGATIVE
Ketones, ur: 40 mg/dL — AB
Nitrite: NEGATIVE
Protein, ur: NEGATIVE mg/dL
Urobilinogen, UA: 0.2 mg/dL (ref 0.0–1.0)

## 2011-02-01 LAB — LIPASE, BLOOD: Lipase: 9 U/L — ABNORMAL LOW (ref 11–59)

## 2011-02-01 LAB — DIFFERENTIAL
Basophils Relative: 0 % (ref 0–1)
Eosinophils Absolute: 0.1 10*3/uL (ref 0.0–0.7)
Eosinophils Relative: 1 % (ref 0–5)
Lymphs Abs: 2.7 10*3/uL (ref 0.7–4.0)
Monocytes Relative: 12 % (ref 3–12)
Neutrophils Relative %: 52 % (ref 43–77)

## 2011-02-01 LAB — COMPREHENSIVE METABOLIC PANEL
Alkaline Phosphatase: 86 U/L (ref 39–117)
BUN: <3 mg/dL — ABNORMAL LOW (ref 6–23)
Creatinine, Ser: <0.47 mg/dL (ref 0.4–1.2)
Glucose, Bld: 95 mg/dL (ref 70–99)
Potassium: 3 mEq/L — ABNORMAL LOW (ref 3.5–5.1)
Total Protein: 5.4 g/dL — ABNORMAL LOW (ref 6.0–8.3)

## 2011-02-01 LAB — PHOSPHORUS: Phosphorus: 3.5 mg/dL (ref 2.3–4.6)

## 2011-02-01 LAB — MAGNESIUM: Magnesium: 1.8 mg/dL (ref 1.5–2.5)

## 2011-02-02 LAB — CBC
MCH: 31.2 pg (ref 26.0–34.0)
MCHC: 33 g/dL (ref 30.0–36.0)
Platelets: 285 10*3/uL (ref 150–400)
RBC: 3.82 MIL/uL — ABNORMAL LOW (ref 3.87–5.11)

## 2011-02-02 LAB — COMPREHENSIVE METABOLIC PANEL
AST: 7 U/L (ref 0–37)
Albumin: 2.2 g/dL — ABNORMAL LOW (ref 3.5–5.2)
Calcium: 8 mg/dL — ABNORMAL LOW (ref 8.4–10.5)
Chloride: 106 mEq/L (ref 96–112)
Creatinine, Ser: 0.47 mg/dL (ref 0.4–1.2)
GFR calc Af Amer: >60 mL/min (ref >60)
Sodium: 140 mEq/L (ref 135–145)

## 2011-02-03 LAB — BASIC METABOLIC PANEL
CO2: 26 mEq/L (ref 19–32)
Glucose, Bld: 82 mg/dL (ref 70–99)
Potassium: 4.5 mEq/L (ref 3.5–5.1)
Sodium: 141 mEq/L (ref 135–145)

## 2011-02-04 LAB — CBC
HCT: 34.9 % — ABNORMAL LOW (ref 36.0–46.0)
Hemoglobin: 11.4 g/dL — ABNORMAL LOW (ref 12.0–15.0)
MCH: 31.6 pg (ref 26.0–34.0)
MCHC: 32.7 g/dL (ref 30.0–36.0)
MCV: 96.7 fL (ref 78.0–100.0)

## 2011-02-04 LAB — BASIC METABOLIC PANEL
BUN: <3 mg/dL — ABNORMAL LOW (ref 6–23)
CO2: 27 mEq/L (ref 19–32)
Calcium: 7.8 mg/dL — ABNORMAL LOW (ref 8.4–10.5)
Creatinine, Ser: <0.47 mg/dL (ref 0.4–1.2)
Glucose, Bld: 81 mg/dL (ref 70–99)

## 2011-02-05 LAB — URINALYSIS, ROUTINE W REFLEX MICROSCOPIC
Bilirubin Urine: NEGATIVE
Hgb urine dipstick: NEGATIVE
Protein, ur: NEGATIVE mg/dL
Specific Gravity, Urine: 1.007 (ref 1.005–1.030)
Urobilinogen, UA: 0.2 mg/dL (ref 0.0–1.0)

## 2011-02-06 LAB — URINE CULTURE: Special Requests: NEGATIVE

## 2011-02-07 DIAGNOSIS — F339 Major depressive disorder, recurrent, unspecified: Secondary | ICD-10-CM

## 2011-02-08 LAB — COMPREHENSIVE METABOLIC PANEL
AST: 16 U/L (ref 0–37)
Albumin: 2.6 g/dL — ABNORMAL LOW (ref 3.5–5.2)
Calcium: 8.6 mg/dL (ref 8.4–10.5)
Chloride: 105 mEq/L (ref 96–112)
Creatinine, Ser: <0.47 mg/dL (ref 0.4–1.2)
Total Bilirubin: 0.2 mg/dL — ABNORMAL LOW (ref 0.3–1.2)

## 2011-02-08 LAB — CBC
HCT: 37.3 % (ref 36.0–46.0)
Hemoglobin: 12.2 g/dL (ref 12.0–15.0)
MCH: 31.5 pg (ref 26.0–34.0)
MCV: 96.4 fL (ref 78.0–100.0)
RBC: 3.87 MIL/uL (ref 3.87–5.11)

## 2011-02-09 NOTE — Discharge Summary (Signed)
Katherine Beck, KISSEL                ACCOUNT NO.:  [id number]  MEDICAL RECORD NO.:  [id number]  LOCATION:  1312                         FACILITY:  [facility]  PHYSICIAN:  Hillery Aldo, M.D.   DATE OF BIRTH:  November 17, 1955  DATE OF ADMISSION:  02/01/2011 DATE OF DISCHARGE:  02/08/2011                              DISCHARGE SUMMARY   PRIMARY CARE PHYSICIAN:  Georganna Skeans, M.D. at [facility].  PSYCHIATRIST:  Conni Slipper, M.D.  DISCHARGE DIAGNOSES: 1. Recurrent Clostridium difficile colitis. 2. Asymptomatic hypotension. 3. Persistent abdominal pain. 4. Gastroesophageal reflux disease in the setting of a history of     gastric bypass surgery. 5. Depression. 6. Generalized anxiety disorder. 7. Chronic obstructive pulmonary disease. 8. History of seizure disorder. 9. Normocytic anemia. 10.Protein-calorie malnutrition. 11.History of migraine headaches.  DISCHARGE MEDICATIONS: 1. Alprazolam 0.5 mg p.o. t.i.d. p.r.n. anxiety. 2. Klonopin 0.5 mg p.o. b.i.d. 3. Bentyl 20 mg p.o. q.i.d. times 2-3 days then p.r.n. abdominal     cramping. 4. Lexapro 10 mg p.o. daily. 5. Metronidazole 500 mg p.o. t.i.d. x7 days then b.i.d. x7 days then     daily x7 days and every other day x14 days. 6. Florastor 250 mg p.o. b.i.d. 7. Effexor decrease to 75 mg daily x3 days then stop. 8. Advair 500/50 one puff inhaled b.i.d. 9. Albuterol inhaler 1-2 puffs q.4h. p.r.n. dyspnea. 10.Dilantin extended release 100 mg p.o. t.i.d. 11.Meclizine 25 mg p.o. t.i.d. 12.Meloxicam 15 mg p.o. daily. 13.Multivitamin 1 tablet p.o. daily. 14.Protonix 40 mg p.o. daily. 15.Spiriva 80 mcg p.o. daily. 16.Trazodone 150 mg p.o. q.h.s. 17.Vicodin 5/500 one to two tablets p.o. q.6h. p.r.n. pain (#30     written for discharge). 18.Vitamin D3 OTC 2 tablets p.o. daily. 19.Zinc OTC 1 tablet p.o. daily.  CONSULTATIONS:  Conni Slipper, M.D. of psychiatry.  BRIEF ADMISSION HPI:  Katherine Beck is a 55 year old  female, who was hospitalized from December 26, 2010 through Dec 30, 2010 with Clostridium difficile infection.  She was slow to progress due to persistent abdominal pain and ultimately was discharged home on vancomycin for 10 days post discharge.  Katherine Beck reports a return of her symptoms with nausea, vomiting and diarrhea shortly after completing her course of vancomycin.  She reports diarrheal stools, up to 10 times per day over the past 2 days prior to presentation and subsequently was referred to the hospitalist service for further evaluation and treatment.  For the full details, please see the dictated report by Dr. Mikeal Hawthorne.  PROCEDURES AND DIAGNOSTIC STUDIES:  None.  DISCHARGE LABORATORY VALUES:  Sodium was 143, potassium 4.2, chloride 105, bicarb 33, BUN 5, creatinine less than 0.47, glucose 87, total bilirubin 0.2, alkaline phosphatase 67, AST 16, ALT 9, total protein 5.1, albumin 2.6, calcium 8.6.  White blood cell count was 5.4, hemoglobin 12.2, hematocrit 37.3, platelets 303,000.  HOSPITAL COURSE BY PROBLEM: 1. Recurrent Clostridium difficile colitis infection:  Katherine Beck was     admitted and put on Flagyl.  She has now completed 7 days of     therapy with Flagyl.  Florastor was also added to her regimen.     Given her recurrent infection, she will be discharged on a slow  taper as outlined above. 2. Asymptomatic hypotension:  Katherine Beck was persistently hypotensive for     the first several days of her hospital stay.  She was provided with     IV fluids and at this time, her blood pressure has normalized.  Her     discharge blood pressure is 110/77. 3. Persistent abdominal pain:  Katherine Beck has a known history of gastric     bypass surgery.  We did attempt to discontinue proton pump     inhibitor therapy due to its association with recurrent Clostridium     difficile infection.  Unfortunately, the Katherine Beck's abdominal pain     persisted and ultimately we added back the Protonix.  We  also     treated her with Bentyl and Xanax due to a possible somatization     component.  Katherine Beck's abdominal pain is currently improved and she     will be discharged home with Bentyl and Xanax to use as needed. 4. Gastroesophageal reflux disease:  Related to her history of bypass     surgery.  She was on b.i.d. proton pump inhibitor therapy during     her last hospital stay, which we were able to get down to once     daily.  She will be discharged on once daily PPI therapy. 5. Depression and anxiety:  Katherine Beck had significant complaints of     depression with tearful episodes while in the hospital.  She has     multiple psychosocial stressors including her financial situation.     Because of a possible component of somatization, a psychiatry     consultation was requested and kindly provided by Dr. Elsie Saas.     The Katherine Beck's Effexor was tapered and she was started on Lexapro.     She was also put on Klonopin for anxiety control and given a     prescription for Xanax for breakthrough anxiety.  The Katherine Beck's other chronic medical problems as listed have been stable with no changes made to her medical regimen.  DISPOSITION:  The Katherine Beck is medically stable and will be discharged home today.  We will set up appropriate outpatient psychiatric follow-up.  She does have follow-up with Dr. Andrey Campanile scheduled for tomorrow at 2:30 p.m.  She is encouraged to keep this appointment.  She is instructed to decontaminate her bathroom with bleach and to wash all of her bedding and hot soapy water.  CONDITION ON DISCHARGE:  Improved.  Time spent coordinating care for discharge and discharge instructions equals 35 minutes.     Hillery Aldo, M.D.     CR/MEDQ  D:  02/08/2011  T:  02/08/2011  Job:  [id number]  cc:   Conni Slipper, MD  Georganna Skeans, MD Fax: [phone]  Electronically Signed by Hillery Aldo M.D. on 02/09/2011 07:02:38 AM

## 2011-02-14 NOTE — Consult Note (Signed)
NAMEARZELLA, REHMANN NO.:  [id number]  MEDICAL RECORD NO.:  [id number]  LOCATION:  1312                         FACILITY:  [facility]  PHYSICIAN:  Conni Slipper, MDDATE OF BIRTH:  1956-01-12  DATE OF CONSULTATION:  02/07/2011 DATE OF DISCHARGE:                                CONSULTATION   IDENTIFICATION:  Katherine Beck is a 55 year old white female who was admitted to the [facility] for multiple medical problems including stomach upset, C diff infection second time within a month.  The patient has been depressed over 2 years and has failed to follow up with psychiatric outpatient at [facility].  She has been receiving her medication Effexor and trazodone from her primary care physician.  The patient stated that she has been more depressed over the last couple of months secondary to increased medical problems, especially stomach upset and financial difficulties, problems with transportation and suffering with financial aid which she tried.  The patient reportedly has been depressed, anxious, worried, stressed, unable to sleep without medication, dispense of appetite, loss of interest, has passive suicidal ideation without known suicidal plans and intentions.  The patient has some somatic symptoms, exacerbation of the pain from time to time.  PAST PSYCHIATRIC HISTORY:  The patient reportedly was admitted to the [facility] a few years ago but no recent admissions.  The patient has multiple medical problems of COPD, GERD and C diff infection that required antibiotic treatment.  Her current medication for the depression is Effexor XR 150 mg daily and trazodone 150 mg at bedtime for sleep.  PSYCHOSOCIAL HISTORY:  The patient lives with her husband.  She has 2 children, one does not talk to her, one talk to her husband who has lost job and unable to help her March.  MENTAL STATUS EXAMINATION:  The patient appeared  as per her stated age. She is lying down on her bed with depressed and anxious mood and dysphoric affect.  She has normal rate, rhythm and volume of speech. Her thought process is linear and goal directed.  She has passive suicidal ideation.  She has a history of self injurious behaviors.  She has no homicidal ideations.  She has no agitation, aggressive behaviors. She does not appear to be hallucinating or delusional or paranoid.  She has fair to poor insight, judgment and impulse control.  DIAGNOSES:  AXIS I: 1. Major depressive disorder, recurrent without psychotic symptoms. 2. Anxiety disorder, not otherwise specified. AXIS II:  Deferred. AXIS III:  Multiple medical problems. AXIS IV:  Problems with primary support, financial difficulties, housing, chronic medical conditions. AXIS V:  Global Assessment of Functioning was 38 to 45.  TREATMENT PLAN:  Recommends change in her antidepressant medication due to not effective, decrease Effexor XR to 75 mg once a day for 3 days and discontinue.  Start trial of new antidepressant medication Lexapro  10 mg once daily and  Klonopin 0.5 mg twice daily for anxiety.  Monitor for adverse effect of the medications and psychiatric will follow up with her thank you     Conni Slipper, MD     JRJ/MEDQ  D:  02/07/2011  T:  02/07/2011  Job:  [id number]  Electronically Signed by Leata Mouse MD on 02/14/2011 12:00:39 PM

## 2011-02-28 NOTE — H&P (Signed)
NAMEROBBIE, NANGLE                ACCOUNT NO.:  [id number]  MEDICAL RECORD NO.:  [id number]  LOCATION:  1312                         FACILITY:  [facility]  PHYSICIAN:  Lonia Blood, M.D.      DATE OF BIRTH:  November 04, 1955  DATE OF ADMISSION:  02/01/2011 DATE OF DISCHARGE:                             HISTORY & PHYSICAL   PRIMARY CARE PHYSICIAN:  She goes to [facility].  PRESENTING COMPLAINT:  Nausea, vomiting, and diarrhea.  HISTORY OF PRESENT ILLNESS:  The patient is a 55 year old female with known history of Clostridium difficile colitis from previous admissions, who was discharged from the hospital back on May 2nd after admission with Clostridium difficile colitis.  The patient was doing well apparently up until May 12th when she finished her antibiotics.  In the last 2 days, however, she has had recurrence of her nausea, vomiting, and diarrhea.  This has been persistent until she came to the emergency room.  Her nausea and vomiting has subsided now, but she continues to have multiple bouts of diarrhea at least 10 times a day.  No abdominal pain.  No hematochezia.  No hematemesis.  No melena.  PAST MEDICAL HISTORY: 1. Clostridium difficile colitis. 2. COPD. 3. Seizure disorder. 4. Bipolar disorder. 5. GERD. 6. Normocytic anemia. 7. History of protein-calorie malnutrition. 8. History of migraine headaches. 9. Status post gastric bypass surgery. 10.Status post hysterectomy. 11.Status post bilateral fasciectomies. 12.Status post left wrist ganglion excision. 13.History of vertigo. 14.Depression and anxiety.  ALLERGIES:  SULFA.  CURRENT MEDICATIONS: 1. Florastor 250 mg p.o. b.i.d., which she finished recently. 2. Vicodin 1 tablet q.6 hours p.r.n. 3. Zofran 4 mg q.6 h. p.r.n. 4. Dilantin 100 mg 3 times daily. 5. Advair Diskus 50/500 one puff b.i.d. 6. Albuterol 1-2 puffs q.4 h. P.r.n. 7. Aleve 220 mg 1-2 tablets twice daily. 8. Meclizine 25 mg every 3 times a day as  needed. 9. Meloxicam 15 mg daily. 10.Multivitamins 1 tablet daily. 11.Naprosyn 500 mg b.i.d. 12.Protonix 40 mg daily. 13.Spiriva HandiHaler 1 capsule daily. 14.sumatriptan 100 mg daily as needed. 15.Tramadol 50 mg t.i.d. 16.Trazodone 150 mg daily. 17.Venlafaxine XR 150 mg daily. 18.Vitamin D3 over-the-counter 2 tablets daily. 19.Spiriva 18 mcg daily. 20.Zinc over-the-counter 1 tablet daily.  SOCIAL HISTORY:  The patient lives in [place].  She is indigent, originally from [place]. She is married.  She works seasonally in [employer], smokes about a pack per day, which she has done for 40 years.  No alcohol.  No IV drug use.  Occasional cannabis.  FAMILY HISTORY:  Significant for coronary artery disease in father, who died at age of 22.  Mother is alive in her 47s with thyroid disease and diabetes.  REVIEW OF SYSTEMS:  All systems reviewed are negative except per HPI.  PHYSICAL EXAMINATION:  VITAL SIGNS:  Temperature is 98.9, blood pressure 91/60 with pulse 81, respiratory rate of 18, saturation 96% on room air. GENERAL:  She is awake, alert, oriented.  She is in no acute distress. HEENT:  PERRL.  EOMI.  No pallor.  No jaundice.  No rhinorrhea. NECK:  Supple.  No visible JVD.  No lymphadenopathy. RESPIRATORY:  She has good air entry bilaterally.  No wheezes.  No rales.  No crackles. CARDIOVASCULAR SYSTEM:  She has S1 and S2.  No audible murmur. ABDOMEN:  Soft, full, nontender with positive bowel sounds. EXTREMITIES:  No edema, cyanosis, or clubbing. SKIN:  No rashes.  No ulcers.  LABORATORY DATA:  White count is 7.6, hemoglobin 13.8, platelet of 352 with normal differentials.  Urinalysis showed amber urine with some ketones.  Sodium 139, potassium 3.0, chloride 100, CO2 of 29, glucose 95, BUN less than 3, creatinine less than 0.47, calcium 8.0, albumin is 2.9 with AST of 10.  The rest of the LFTs are normal.  Lipase is 9. Magnesium 1.8, phosphorus 2.5.  ASSESSMENT:   This is a 55 year old female with known history of Clostridium difficile colitis presenting with recurrent nausea, vomiting, diarrhea, consistent with recurrent Clostridium difficile colitis.  PLAN: 1. Recurrent Clostridium difficile colitis.  We will admit the patient     and restart her on oral Flagyl as tolerated, IV fluids.  We may     consider GI consult as well if needed, but more than likely this     recurrence of her Clostridium difficile from maturing of previously     spores. 2. COPD.  Continuous nebulizers as necessary. 3. Bipolar disorder.  We will continue with her home medication as     necessary. 4. GERD.  Continuous PPI. 5. Hypokalemia.  We will replete the potassium. 6. History of migraine headaches.  Continue with home medication as     necessary.  Further treatment will depend on how the patient     responds to these measures.  We will probably need to use probiotic     once again.     Lonia Blood, M.D.     Verlin Grills  D:  02/02/2011  T:  02/02/2011  Job:  [id number]  Electronically Signed by Lonia Blood M.D. on 02/28/2011 12:30:56 AM

## 2011-04-05 ENCOUNTER — Ambulatory Visit (HOSPITAL_COMMUNITY): Payer: Medicare Other | Admitting: Psychiatry

## 2011-05-26 ENCOUNTER — Ambulatory Visit (INDEPENDENT_AMBULATORY_CARE_PROVIDER_SITE_OTHER): Payer: Medicare Other | Admitting: Psychiatry

## 2011-05-26 DIAGNOSIS — F3189 Other bipolar disorder: Secondary | ICD-10-CM
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
## 2011-06-04 LAB — BLOOD GAS, ARTERIAL
Acid-Base Excess: 2.1 — ABNORMAL HIGH
Bicarbonate: 25.7 — ABNORMAL HIGH
O2 Saturation: 94.6
TCO2: 22.6
pO2, Arterial: 81.8

## 2011-06-08 ENCOUNTER — Encounter (INDEPENDENT_AMBULATORY_CARE_PROVIDER_SITE_OTHER): Payer: Medicare Other | Admitting: Psychology

## 2011-06-08 DIAGNOSIS — F39 Unspecified mood [affective] disorder: Secondary | ICD-10-CM

## 2011-06-14 ENCOUNTER — Ambulatory Visit (HOSPITAL_COMMUNITY): Payer: Medicare Other | Admitting: Physician Assistant

## 2011-06-15 LAB — RAPID URINE DRUG SCREEN, HOSP PERFORMED
Amphetamines: NOT DETECTED
Barbiturates: NOT DETECTED
Benzodiazepines: NOT DETECTED
Cocaine: NOT DETECTED
Opiates: NOT DETECTED
Tetrahydrocannabinol: NOT DETECTED

## 2011-06-15 LAB — DIFFERENTIAL
Basophils Absolute: 0
Basophils Relative: 0
Eosinophils Absolute: 0.1
Eosinophils Relative: 2
Lymphs Abs: 2.4
Neutrophils Relative %: 45

## 2011-06-15 LAB — BASIC METABOLIC PANEL
BUN: 5 — ABNORMAL LOW
Chloride: 104
Creatinine, Ser: 0.49
Glucose, Bld: 87
Potassium: 3.9

## 2011-06-15 LAB — ETHANOL: Alcohol, Ethyl (B): <5

## 2011-06-15 LAB — CBC
HCT: 40.4
MCV: 92.5
Platelets: 190
RDW: 12.2
WBC: 5.3

## 2011-06-15 LAB — VITAMIN B12: Vitamin B-12: 282 (ref 211–911)

## 2011-06-15 LAB — PHENYTOIN LEVEL, TOTAL: Phenytoin Lvl: 7.4 — ABNORMAL LOW

## 2011-06-16 ENCOUNTER — Emergency Department (HOSPITAL_COMMUNITY): Payer: Medicare Other

## 2011-06-16 ENCOUNTER — Emergency Department (HOSPITAL_COMMUNITY)
Admission: EM | Admit: 2011-06-16 | Discharge: 2011-06-16 | Disposition: A | Discharge status: Home / Self Care | Payer: Medicare Other | Attending: Emergency Medicine | Admitting: Emergency Medicine

## 2011-06-16 DIAGNOSIS — X500XXA Overexertion from strenuous movement or load, initial encounter: Secondary | ICD-10-CM | POA: Insufficient documentation

## 2011-06-16 DIAGNOSIS — J4489 Other specified chronic obstructive pulmonary disease: Secondary | ICD-10-CM | POA: Insufficient documentation

## 2011-06-16 DIAGNOSIS — M25579 Pain in unspecified ankle and joints of unspecified foot: Secondary | ICD-10-CM | POA: Insufficient documentation

## 2011-06-16 DIAGNOSIS — R079 Chest pain, unspecified: Secondary | ICD-10-CM | POA: Insufficient documentation

## 2011-06-16 DIAGNOSIS — G40909 Epilepsy, unspecified, not intractable, without status epilepticus: Secondary | ICD-10-CM | POA: Insufficient documentation

## 2011-06-16 DIAGNOSIS — J449 Chronic obstructive pulmonary disease, unspecified: Secondary | ICD-10-CM | POA: Insufficient documentation

## 2011-06-16 DIAGNOSIS — Z79899 Other long term (current) drug therapy: Secondary | ICD-10-CM | POA: Insufficient documentation

## 2011-06-25 ENCOUNTER — Ambulatory Visit (INDEPENDENT_AMBULATORY_CARE_PROVIDER_SITE_OTHER): Payer: Medicare Other | Admitting: Physician Assistant

## 2011-06-25 DIAGNOSIS — F39 Unspecified mood [affective] disorder: Secondary | ICD-10-CM

## 2011-06-29 ENCOUNTER — Encounter (INDEPENDENT_AMBULATORY_CARE_PROVIDER_SITE_OTHER): Payer: Medicare Other | Admitting: Psychology

## 2011-06-29 DIAGNOSIS — F309 Manic episode, unspecified: Secondary | ICD-10-CM

## 2011-07-12 ENCOUNTER — Ambulatory Visit (INDEPENDENT_AMBULATORY_CARE_PROVIDER_SITE_OTHER): Payer: Medicare Other | Admitting: Physician Assistant

## 2011-07-12 ENCOUNTER — Encounter (HOSPITAL_COMMUNITY): Payer: Self-pay | Admitting: Physician Assistant

## 2011-07-12 ENCOUNTER — Other Ambulatory Visit (HOSPITAL_COMMUNITY): Payer: Self-pay | Admitting: Physician Assistant

## 2011-07-12 VITALS — BP 105/74 | HR 73 | Temp 97.1°F | Ht 62.5 in | Wt 170.6 lb

## 2011-07-12 DIAGNOSIS — R5383 Other fatigue: Secondary | ICD-10-CM

## 2011-07-12 DIAGNOSIS — E559 Vitamin D deficiency, unspecified: Secondary | ICD-10-CM

## 2011-07-12 DIAGNOSIS — Z5982 Transportation insecurity: Secondary | ICD-10-CM | POA: Insufficient documentation

## 2011-07-12 DIAGNOSIS — R5381 Other malaise: Secondary | ICD-10-CM

## 2011-07-12 DIAGNOSIS — F3181 Bipolar II disorder: Secondary | ICD-10-CM

## 2011-07-12 DIAGNOSIS — F1920 Other psychoactive substance dependence, uncomplicated: Secondary | ICD-10-CM

## 2011-07-12 DIAGNOSIS — F3189 Other bipolar disorder: Secondary | ICD-10-CM

## 2011-07-12 DIAGNOSIS — Z9189 Other specified personal risk factors, not elsewhere classified: Secondary | ICD-10-CM

## 2011-07-12 DIAGNOSIS — Z598 Other problems related to housing and economic circumstances: Secondary | ICD-10-CM

## 2011-07-12 DIAGNOSIS — Z789 Other specified health status: Secondary | ICD-10-CM

## 2011-07-12 DIAGNOSIS — Z5987 Material hardship due to limited financial resources, not elsewhere classified: Secondary | ICD-10-CM

## 2011-07-12 DIAGNOSIS — G40909 Epilepsy, unspecified, not intractable, without status epilepticus: Secondary | ICD-10-CM

## 2011-07-12 LAB — CBC WITH DIFFERENTIAL/PLATELET
Eosinophils Relative: 2 % (ref 0–5)
HCT: 38.4 % (ref 36.0–46.0)
Lymphocytes Relative: 49 % — ABNORMAL HIGH (ref 12–46)
Lymphs Abs: 3.1 10*3/uL (ref 0.7–4.0)
MCV: 97.7 fL (ref 78.0–100.0)
Monocytes Absolute: 0.4 10*3/uL (ref 0.1–1.0)
Neutro Abs: 2.7 10*3/uL (ref 1.7–7.7)
Platelets: 245 10*3/uL (ref 150–400)
RBC: 3.93 MIL/uL (ref 3.87–5.11)
WBC: 6.3 10*3/uL (ref 4.0–10.5)

## 2011-07-12 LAB — TSH: TSH: 4.225 u[IU]/mL (ref 0.350–4.500)

## 2011-07-12 MED ORDER — RISPERIDONE 1 MG PO TABS: 1.0000 mg | ORAL_TABLET | Freq: Two times a day (BID) | ORAL | Status: DC

## 2011-07-12 MED ORDER — TRAZODONE HCL 150 MG PO TABS: 150.0000 mg | ORAL_TABLET | Freq: Every day | ORAL | Status: DC

## 2011-07-12 MED ORDER — ESCITALOPRAM OXALATE 20 MG PO TABS: 20.0000 mg | ORAL_TABLET | Freq: Every day | ORAL | Status: DC

## 2011-07-12 MED ORDER — CLONAZEPAM 0.5 MG PO TABS: 0.5000 mg | ORAL_TABLET | Freq: Two times a day (BID) | ORAL | Status: DC | PRN

## 2011-07-12 NOTE — Patient Instructions (Signed)
Katherine Beck is encouraged to discontinue marijuana use.  She must continue in out patient therapy.

## 2011-07-12 NOTE — Progress Notes (Signed)
[facility] [address] Progress Note  Katherine Beck [id number] 55 y.o.  07/12/2011 10:30 AM  Chief Complaint: Medication refill and follow up  History of Present Illness:Pt. Notes that she smoked weed at a baby shower over the weekend. Suicidal Ideation: No Plan Formed: No Patient has means to carry out plan: No  Homicidal Ideation: No Plan Formed: No Patient has means to carry out plan: No  Review of Systems: Psychiatric: Agitation: No Hallucination: No Depressed Mood: Yes Insomnia: No Hypersomnia: No Altered Concentration: No Feels Worthless: Yes Grandiose Ideas: No Belief In Special Powers: No New/Increased Substance Abuse: Yes Compulsions: No  Neurologic: Headache: Yes Seizure: No Paresthesias: No  Past Medical Family, Social History:   Outpatient Encounter Prescriptions as of 07/12/2011  Medication Sig Dispense Refill  . clonazePAM (KLONOPIN) 0.5 MG tablet Take 0.5 mg by mouth.        . dicyclomine (BENTYL) 20 MG tablet Take 20 mg by mouth every 6 (six) hours.        Marland Kitchen escitalopram (LEXAPRO) 20 MG tablet Take 20 mg by mouth daily.        Marland Kitchen ibuprofen (ADVIL,MOTRIN) 600 MG tablet Take 600 mg by mouth every 8 (eight) hours as needed.        . meclizine (ANTIVERT) 25 MG tablet Take 25 mg by mouth 3 (three) times daily as needed.        . naproxen sodium (ANAPROX) 550 MG tablet Take 550 mg by mouth 2 (two) times daily with a meal.        . pantoprazole (PROTONIX) 40 MG tablet Take 40 mg by mouth daily.        . phenytoin (DILANTIN) 100 MG ER capsule Take by mouth 3 (three) times daily.        . risperiDONE (RISPERDAL) 1 MG tablet Take 1 mg by mouth 2 (two) times daily. 1/2 tab in AM, 1 tab at HS.       . traMADol (ULTRAM) 50 MG tablet Take 50 mg by mouth every 8 (eight) hours as needed. Maximum dose= 8 tablets per day       . traZODone (DESYREL) 150 MG tablet Take 150 mg by mouth at bedtime.          Past Psychiatric History/Hospitalization(s): Anxiety:  Yes Bipolar Disorder: Yes Depression: Yes Mania: No Psychosis: No Schizophrenia: No Personality Disorder: No Hospitalization for psychiatric illness: Yes History of Electroconvulsive Shock Therapy: No Prior Suicide Attempts: Yes  Physical Exam: Constitutional:  BP 105/74  Pulse 73  Temp(Src) 97.1 F (36.2 C) (Temporal)  Ht 5' 2.5" (1.588 m)  Wt 170 lb 9.6 oz (77.384 kg)  BMI 30.71 kg/m2  General Appearance: alert, oriented, no acute distress  Musculoskeletal: Strength & Muscle Tone: within normal limits Gait & Station: normal Patient leans: N/A  Psychiatric: Speech (describe rate, volume, coherence, spontaneity, and abnormalities if any): Normal speech rate and rhythm  Thought Process (describe rate, content, abstract reasoning, and computation): linear  Associations: Coherent  Thoughts: normal  Mental Status: Orientation: oriented to person, place, time/date, situation and day of week Mood & Affect: normal affect Attention Span & Concentration: normal  Medical Decision Making (Choose Three): Order AIMS Test (2), Established Problem, Worsening (2), Review of Last Therapy Session (1) and Review or order medicine tests (1)  Assessment: Axis I: Bipolar disorder most recent episode depressed, recurrent, severe.  Axis II: N/A  Axis III: Burden of multiple chronic medical illnesses, burden of long term mental illness.  Axis  IV: financial, housing, education, employement  Axis V: GAF:60    Plan: Strongly discouraged THC use.           Meds refilled as noted           Must continue in Out Pt. Therapy.  Mikaiah Stoffer, PA 07/12/2011

## 2011-07-13 LAB — COMPREHENSIVE METABOLIC PANEL
ALT: <8 U/L (ref 0–35)
Albumin: 4.3 g/dL (ref 3.5–5.2)
CO2: 26 mEq/L (ref 19–32)
Calcium: 9.2 mg/dL (ref 8.4–10.5)
Chloride: 100 mEq/L (ref 96–112)
Creat: 0.61 mg/dL (ref 0.50–1.10)
Potassium: 4.5 mEq/L (ref 3.5–5.3)

## 2011-07-13 LAB — VITAMIN D 25 HYDROXY (VIT D DEFICIENCY, FRACTURES): Vit D, 25-Hydroxy: 34 ng/mL (ref 30–89)

## 2011-07-15 ENCOUNTER — Encounter: Payer: Self-pay | Admitting: Obstetrics & Gynecology

## 2011-07-15 ENCOUNTER — Ambulatory Visit (INDEPENDENT_AMBULATORY_CARE_PROVIDER_SITE_OTHER): Payer: Medicare Other | Admitting: Obstetrics & Gynecology

## 2011-07-15 VITALS — BP 128/84 | HR 116 | Temp 98.1°F | Ht 62.0 in | Wt 170.2 lb

## 2011-07-15 DIAGNOSIS — R1909 Other intra-abdominal and pelvic swelling, mass and lump: Secondary | ICD-10-CM

## 2011-07-15 DIAGNOSIS — Z0000 Encounter for general adult medical examination without abnormal findings: Secondary | ICD-10-CM

## 2011-07-15 DIAGNOSIS — R51 Headache: Secondary | ICD-10-CM

## 2011-07-15 DIAGNOSIS — Z01419 Encounter for gynecological examination (general) (routine) without abnormal findings: Secondary | ICD-10-CM

## 2011-07-15 DIAGNOSIS — N83209 Unspecified ovarian cyst, unspecified side: Secondary | ICD-10-CM

## 2011-07-15 MED ORDER — IBUPROFEN 200 MG PO TABS: 200.0000 mg | ORAL_TABLET | Freq: Four times a day (QID) | ORAL | Status: DC | PRN

## 2011-07-15 MED ORDER — IBUPROFEN 200 MG PO TABS
800.0000 mg | ORAL_TABLET | Freq: Once | ORAL | Status: AC
  Administered 2011-07-15: 800 mg via ORAL

## 2011-07-15 NOTE — Progress Notes (Signed)
Addended by: Lynnell Dike on: 07/15/2011 04:02 PM   Modules accepted: Orders

## 2011-07-15 NOTE — Progress Notes (Signed)
  Subjective:    Patient ID: Katherine Beck, female    DOB: Jun 03, 1956, 55 y.o.   MRN: [id number]  HPI Katherine Beck is a 55 yo MW female who had a CT in April 2012 that showed a 4 cm ovarian cyst.  Her only complaint today is that of a smell beneath her panus and a decrease in her libido.   Review of Systems    no recent mammogram Objective:   Physical Exam Abd-benign, slight rash beneath panus from lack of air EG-moderate atrophy Bimanual- no appreciable masses, but exam limited by habitus      Assessment & Plan:  Ovarian cyst 4/12- I have ordered an u/s and a CA-125 and a screening mammogram.

## 2011-07-15 NOTE — Progress Notes (Signed)
Addended by: Lynnell Dike on: 07/15/2011 03:58 PM   Modules accepted: Orders

## 2011-07-27 ENCOUNTER — Other Ambulatory Visit: Payer: Self-pay | Admitting: Internal Medicine

## 2011-07-28 DIAGNOSIS — N83209 Unspecified ovarian cyst, unspecified side: Secondary | ICD-10-CM | POA: Insufficient documentation

## 2011-07-30 ENCOUNTER — Telehealth: Payer: Self-pay | Admitting: *Deleted

## 2011-07-30 ENCOUNTER — Other Ambulatory Visit: Payer: Self-pay | Admitting: Obstetrics & Gynecology

## 2011-07-30 DIAGNOSIS — N83209 Unspecified ovarian cyst, unspecified side: Secondary | ICD-10-CM

## 2011-07-30 NOTE — Telephone Encounter (Signed)
Pt left message that she was supposed to have an ultrasound before her next visit and has not been called with appt info.  I called pt and informed her of [place] and mammo appts. Pt voiced understanding.

## 2011-08-04 ENCOUNTER — Ambulatory Visit (HOSPITAL_COMMUNITY)
Admission: RE | Admit: 2011-08-04 | Discharge: 2011-08-04 | Disposition: A | Discharge status: Home / Self Care | Payer: Medicare Other | Source: Ambulatory Visit | Attending: Obstetrics & Gynecology | Admitting: Obstetrics & Gynecology

## 2011-08-04 DIAGNOSIS — N83209 Unspecified ovarian cyst, unspecified side: Secondary | ICD-10-CM | POA: Insufficient documentation

## 2011-08-04 DIAGNOSIS — Z780 Asymptomatic menopausal state: Secondary | ICD-10-CM | POA: Insufficient documentation

## 2011-08-06 ENCOUNTER — Ambulatory Visit (INDEPENDENT_AMBULATORY_CARE_PROVIDER_SITE_OTHER): Payer: Medicare Other | Admitting: Psychology

## 2011-08-06 DIAGNOSIS — F39 Unspecified mood [affective] disorder: Secondary | ICD-10-CM
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
## 2011-08-10 ENCOUNTER — Ambulatory Visit (HOSPITAL_COMMUNITY): Payer: Medicare Other | Admitting: Physician Assistant

## 2011-08-12 ENCOUNTER — Encounter: Payer: Self-pay | Admitting: Family Medicine

## 2011-08-12 ENCOUNTER — Ambulatory Visit (INDEPENDENT_AMBULATORY_CARE_PROVIDER_SITE_OTHER): Payer: Medicare Other | Admitting: Family Medicine

## 2011-08-12 VITALS — BP 107/74 | HR 64 | Temp 98.5°F | Ht 62.0 in | Wt 176.3 lb

## 2011-08-12 DIAGNOSIS — N83209 Unspecified ovarian cyst, unspecified side: Secondary | ICD-10-CM

## 2011-08-12 NOTE — Progress Notes (Signed)
  Subjective:    Patient ID: Katherine Beck, female    DOB: [birth year], 55 y.o.   MRN: [id number]  HPI Patient seen for follow up of testing.  Improved abdominal pain - intermittent.  No other complaints.  Denies vaginal bleeding, vaginal discharge, menses.   Review of Systems     Objective:   Physical Exam Gen: A&O x3, NAD     Assessment & Plan:  1.  Ovarian Cyst Reviewed stable [place] of benign ovarian cyst.  Ca-125 normal.  Follow up as needed.

## 2011-08-18 ENCOUNTER — Other Ambulatory Visit (HOSPITAL_COMMUNITY): Payer: Self-pay | Admitting: Physician Assistant

## 2011-08-25 NOTE — Telephone Encounter (Signed)
This provider transferred to another department and patient's appointment was cancelled. Ok to refill this time but the patient will need to see another provider for next refill.

## 2011-08-26 ENCOUNTER — Other Ambulatory Visit (HOSPITAL_COMMUNITY): Payer: Self-pay | Admitting: Physician Assistant

## 2011-08-29 ENCOUNTER — Other Ambulatory Visit (HOSPITAL_COMMUNITY): Payer: Self-pay | Admitting: Physician Assistant

## 2011-08-29 DIAGNOSIS — F330 Major depressive disorder, recurrent, mild: Secondary | ICD-10-CM

## 2011-09-03 ENCOUNTER — Ambulatory Visit (HOSPITAL_COMMUNITY): Payer: Medicare Other

## 2011-09-09 ENCOUNTER — Ambulatory Visit (INDEPENDENT_AMBULATORY_CARE_PROVIDER_SITE_OTHER): Payer: Medicare Other | Admitting: Psychology

## 2011-09-09 DIAGNOSIS — F341 Dysthymic disorder: Secondary | ICD-10-CM

## 2011-09-09 NOTE — Progress Notes (Signed)
   THERAPIST PROGRESS NOTE  Session Time: 1610-9604  Participation Level: Active  Behavioral Response: Well GroomedAlertAnxious, Dysphoric and Irritable  Type of Therapy: Individual Therapy  Treatment Goals addressed: Anxiety and Coping  Interventions: Supportive and Reframing  Summary: Katherine Beck is a 56 y.o. female who presents with tearfulness and agitation as she relates significant financial stress that has led to not being able to refill her medications (Risperdal and Klonopin).  She states she will be able to get them once she receives her next check, Feb 1.  Otherwise, the holidays had both pleasant and difficult moments that we discussed.  Primarily, she continues to suffer distress related to being put in the middle of her two daughter's conflict over the younger daughter's children who are under the guardianship of the older daughter.  Tajuana vented, cried, explained and also revealed her own solution:  She plans to save enough money to move to [place] where she will help her sister as a companion to elderly people and get out of the middle of her children's lives.  She states that she has actually been assertive at times and feels she can continue to do that "when I get back on my medication".  By the end of the session, she was much calmer, and appreciative of this time to talk things out.   Suicidal/Homicidal: No, without intent/plan  Therapist Response: supportive and let her come to her own conclusions, without trying to rescue her.  Reframed the events as indicated to give her a different perspective.  Plan: Return again in 4 weeks.  Diagnosis: Axis I: Dysthymic Disorder    Axis II: Cluster B Traits    Garry Bochicchio, RN 09/09/2011

## 2011-09-13 ENCOUNTER — Ambulatory Visit (HOSPITAL_COMMUNITY): Payer: Medicare Other | Admitting: Psychiatry

## 2011-09-29 ENCOUNTER — Ambulatory Visit (HOSPITAL_COMMUNITY)
Admission: RE | Admit: 2011-09-29 | Discharge: 2011-09-29 | Disposition: A | Discharge status: Home / Self Care | Payer: Medicare Other | Source: Ambulatory Visit | Attending: Obstetrics & Gynecology | Admitting: Obstetrics & Gynecology

## 2011-09-29 DIAGNOSIS — Z1231 Encounter for screening mammogram for malignant neoplasm of breast: Secondary | ICD-10-CM | POA: Insufficient documentation

## 2011-09-29 DIAGNOSIS — Z0000 Encounter for general adult medical examination without abnormal findings: Secondary | ICD-10-CM

## 2011-10-03 ENCOUNTER — Other Ambulatory Visit (HOSPITAL_COMMUNITY): Payer: Self-pay | Admitting: Physician Assistant

## 2011-10-05 ENCOUNTER — Ambulatory Visit (INDEPENDENT_AMBULATORY_CARE_PROVIDER_SITE_OTHER): Payer: Medicare Other | Admitting: Psychology

## 2011-10-05 DIAGNOSIS — F332 Major depressive disorder, recurrent severe without psychotic features: Secondary | ICD-10-CM

## 2011-10-05 NOTE — Progress Notes (Signed)
   THERAPIST PROGRESS NOTE  Session Time: 1300 - 1350  Participation Level: Active  Behavioral Response: CasualAlertDepressed  Type of Therapy: Individual Therapy  Treatment Goals addressed: Coping  Interventions: Supportive  Summary: Katherine Beck is a 56 y.o. female who presents with severe financial stress since her plan to use her check to pay for her medications this month didn't work out.  Her husband ran out of unemployment credits and has not been active in looking for work.  She had to spend her money to pay the rent for the motel room for a week.  She talked about her frustration at his lack of motivation to get work.  She has run out of all but one medication, one for her stomach.  She sees her PCP this week at [facility] and will try to get some there.  Apparently her medicaid ran out also and she is trying to get that reinstated or get more food stamps.  Other than those stressors, other things with her family are going ok.  One daughter has left her alone, "which is a relief" and the other is unable to help her financially, but is supportive. Her stated goal is to "try to keep myself together so I can go with my husband and help him do the computer applications for work."     Suicidal/Homicidal: Yes, but without intent/plan:  States she sometimes feels like giving up, but "God has plans for me and I just have to trust him."  Therapist Response: Supported her apparent knowledge of how to get help, even if it is panhandling.  Allowed her to express her frustrations and she reports she feels much better after this session, more able to go out and try to handle her life.  We talked about some practical steps she can be taking and she reported feeling able to do it.   Plan: Return again in 4 weeks.  Diagnosis: Axis I: Major Depression, Recurrent severe    Axis II: Cluster B Traits    Bridey Brookover, RN 10/05/2011

## 2011-10-06 ENCOUNTER — Ambulatory Visit (INDEPENDENT_AMBULATORY_CARE_PROVIDER_SITE_OTHER): Payer: Medicare Other | Admitting: Psychiatry

## 2011-10-06 ENCOUNTER — Encounter (HOSPITAL_COMMUNITY): Payer: Self-pay | Admitting: Psychiatry

## 2011-10-06 DIAGNOSIS — F319 Bipolar disorder, unspecified: Secondary | ICD-10-CM

## 2011-10-06 DIAGNOSIS — F330 Major depressive disorder, recurrent, mild: Secondary | ICD-10-CM

## 2011-10-06 DIAGNOSIS — F1210 Cannabis abuse, uncomplicated: Secondary | ICD-10-CM

## 2011-10-06 MED ORDER — RISPERIDONE 1 MG PO TABS: ORAL_TABLET | ORAL | Status: DC

## 2011-10-06 MED ORDER — ESCITALOPRAM OXALATE 20 MG PO TABS: 20.0000 mg | ORAL_TABLET | Freq: Every day | ORAL | Status: DC

## 2011-10-06 MED ORDER — TRAZODONE HCL 150 MG PO TABS: 150.0000 mg | ORAL_TABLET | Freq: Every day | ORAL | Status: DC

## 2011-10-06 NOTE — Progress Notes (Signed)
Chief complaint I am out of my medication  History of presenting illness Patient is 56 year old Caucasian married female who came for her appointment. Patient was seen by physician assistant in November. Patient has history of agitation anger and mood swing. Patient will she is out of her medication for past one month due to financial distress and no money to pay her co-pay at pharmacy. Patient told her husband is not approved for further unemployment and her disability check is paying the rent and utility bills. Patient admitted poor sleep increase agitation and mood swings. Patient also told increased depression crying spells and restlessness. She talked to her case manager and now she is in the process of applying for Medicaid. She acknowledged that she takes medication she do very well. She bring the bottle of medication. She is taking Klonopin which is prescribed by Dr. Darnelle Catalan when she was admitted on medical floor. Patient continues to smoke marijuana on and off. Her last use was around Thanksgiving. Patient admitted she does not have money to smoke marijuana. She continues to have a lot of the stress and family. She admitted getting irritable and angry with the husband however no violence or severe aggression. She wants refill to get her medication filled.  Psychosocial history Patient has been married twice. Her first husband was alcoholic. She has 3 children 2 from her relationship and one from her first husband. Her son and daughter lives in [place]. Patient currently living with her husband.  Alcohol and substance use history Patient admitted history of heavy smoking marijuana on a regular basis however she admitted cutting down her marijuana use. Her last use was 2 months ago. She denies any recent use of alcohol however admitted heavy drinking in the past.  Family history Patient admitted she has a family history psychiatric illness. Both her mother and brother has psychiatric illness.  Her son has been admitted to the psychiatric hospital for psychosis.  Medical history Patient has multiple medical problems including seizure disorder and GERD, migraine headache, status post gastric bypass surgery, hysterectomy, history of vertigo and history of C. difficile.  Medication history Reviewed  Mental status examination Patient is mildly obese who is hearing multiple jewelry and having multiple pierce on her ear. She maintained fair eye contact. She is somewhat emotional labile and tearful. Her speech is pressured and fast. Her thought processes circumstantial with flight of ideas. She denies any active or passive suicidal thoughts or homicidal thoughts. Her attention and concentration is poor. She denies any auditory or visual hallucination. She does have residual paranoia and believed people are watching her. She's alert and oriented x3. Her insight and judgment is fair. Her was control is okay  Diagnoses Axis I mood disorder NOS, rule out bipolar disorder with psychotic features, marijuana abuse Axis II deferred Axis III see medical history Axis IV moderate Axis V 55-60   Plan I talked with patient about the importance of compliance of medication. Talk about risk of relapse and noncompliance of medication. Patient is applying for Medicaid. She will continue to see therapist for increase coping and social skills. I will continue her current medication however stopped Klonopin due to continue use of marijuana. I explained risks and benefits of benzodiazepine. She denies any side effects of medication including any tremors shakes or extrapyramidal side effects. I review her blood work which was done in November and normal. Time spent 30 minutes

## 2011-11-02 ENCOUNTER — Ambulatory Visit (HOSPITAL_COMMUNITY): Payer: Medicare Other | Admitting: Psychology

## 2011-11-03 ENCOUNTER — Ambulatory Visit (HOSPITAL_COMMUNITY): Payer: Self-pay | Admitting: Psychiatry

## 2011-11-04 ENCOUNTER — Telehealth (HOSPITAL_COMMUNITY): Payer: Self-pay | Admitting: Psychology

## 2011-11-04 NOTE — Telephone Encounter (Signed)
See reason for call notes

## 2011-11-05 ENCOUNTER — Other Ambulatory Visit (HOSPITAL_COMMUNITY): Payer: Self-pay | Admitting: Psychiatry

## 2011-11-05 DIAGNOSIS — F331 Major depressive disorder, recurrent, moderate: Secondary | ICD-10-CM

## 2011-11-16 ENCOUNTER — Ambulatory Visit (HOSPITAL_COMMUNITY): Payer: Medicare Other | Admitting: Psychology

## 2011-11-17 ENCOUNTER — Other Ambulatory Visit (HOSPITAL_COMMUNITY): Payer: Self-pay | Admitting: *Deleted

## 2011-11-17 ENCOUNTER — Ambulatory Visit (HOSPITAL_COMMUNITY): Payer: Self-pay | Admitting: Psychiatry

## 2011-11-17 DIAGNOSIS — F319 Bipolar disorder, unspecified: Secondary | ICD-10-CM

## 2011-11-17 DIAGNOSIS — F1210 Cannabis abuse, uncomplicated: Secondary | ICD-10-CM

## 2011-11-17 MED ORDER — RISPERIDONE 1 MG PO TABS: ORAL_TABLET | ORAL | Status: DC

## 2011-12-01 ENCOUNTER — Telehealth (HOSPITAL_COMMUNITY): Payer: Self-pay | Admitting: *Deleted

## 2011-12-01 NOTE — Telephone Encounter (Signed)
See Visit Info.

## 2011-12-07 ENCOUNTER — Telehealth (HOSPITAL_COMMUNITY): Payer: Self-pay | Admitting: *Deleted

## 2011-12-07 NOTE — Telephone Encounter (Signed)
Responded to VM left by patient requesting refills on meds since unable to keep appt 4/9. Patient has moved to [facility] and wants to see an MD in the [facility] office there.Pt has refills on Lexapro and Trazodone.Risperdal not ready for refill at this time, was written 11/17/11 for 30 days.

## 2011-12-08 ENCOUNTER — Ambulatory Visit (HOSPITAL_COMMUNITY): Payer: Self-pay | Admitting: Psychiatry

## 2011-12-09 ENCOUNTER — Ambulatory Visit (HOSPITAL_COMMUNITY): Payer: Self-pay | Admitting: Psychiatry

## 2011-12-10 ENCOUNTER — Ambulatory Visit (HOSPITAL_COMMUNITY): Payer: Medicare Other | Admitting: Psychology

## 2011-12-10 DIAGNOSIS — F329 Major depressive disorder, single episode, unspecified: Secondary | ICD-10-CM | POA: Insufficient documentation

## 2011-12-14 ENCOUNTER — Ambulatory Visit (INDEPENDENT_AMBULATORY_CARE_PROVIDER_SITE_OTHER): Payer: Medicare Other | Admitting: Psychiatry

## 2011-12-14 ENCOUNTER — Encounter (HOSPITAL_COMMUNITY): Payer: Self-pay | Admitting: Psychiatry

## 2011-12-14 VITALS — BP 117/76 | HR 70 | Ht 61.0 in | Wt 183.0 lb

## 2011-12-14 DIAGNOSIS — F1210 Cannabis abuse, uncomplicated: Secondary | ICD-10-CM

## 2011-12-14 DIAGNOSIS — F319 Bipolar disorder, unspecified: Secondary | ICD-10-CM

## 2011-12-14 DIAGNOSIS — F3132 Bipolar disorder, current episode depressed, moderate: Secondary | ICD-10-CM

## 2011-12-14 DIAGNOSIS — F331 Major depressive disorder, recurrent, moderate: Secondary | ICD-10-CM

## 2011-12-14 MED ORDER — ESCITALOPRAM OXALATE 20 MG PO TABS: 20.0000 mg | ORAL_TABLET | Freq: Every day | ORAL | Status: DC

## 2011-12-14 MED ORDER — TRAZODONE HCL 150 MG PO TABS: 150.0000 mg | ORAL_TABLET | Freq: Every day | ORAL | Status: DC

## 2011-12-14 MED ORDER — RISPERIDONE 1 MG PO TABS: ORAL_TABLET | ORAL | Status: DC
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
## 2011-12-15 DIAGNOSIS — F3132 Bipolar disorder, current episode depressed, moderate: Secondary | ICD-10-CM | POA: Insufficient documentation

## 2011-12-15 MED ORDER — TRAZODONE HCL 150 MG PO TABS: 150.0000 mg | ORAL_TABLET | Freq: Every day | ORAL | Status: DC

## 2011-12-15 MED ORDER — RISPERIDONE 1 MG PO TABS: ORAL_TABLET | ORAL | Status: DC

## 2011-12-15 MED ORDER — RISPERIDONE 1 MG PO TABS: 1.0000 mg | ORAL_TABLET | Freq: Two times a day (BID) | ORAL | Status: DC

## 2011-12-15 MED ORDER — ESCITALOPRAM OXALATE 20 MG PO TABS: 20.0000 mg | ORAL_TABLET | Freq: Every day | ORAL | Status: DC

## 2011-12-16 ENCOUNTER — Encounter (HOSPITAL_COMMUNITY): Payer: Self-pay | Admitting: Licensed Clinical Social Worker

## 2011-12-16 ENCOUNTER — Ambulatory Visit (INDEPENDENT_AMBULATORY_CARE_PROVIDER_SITE_OTHER): Payer: Medicare Other | Admitting: Licensed Clinical Social Worker

## 2011-12-16 DIAGNOSIS — F319 Bipolar disorder, unspecified: Secondary | ICD-10-CM
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
## 2011-12-21 ENCOUNTER — Ambulatory Visit (INDEPENDENT_AMBULATORY_CARE_PROVIDER_SITE_OTHER): Payer: Medicare Other | Admitting: Family Medicine

## 2011-12-21 ENCOUNTER — Encounter: Payer: Self-pay | Admitting: Family Medicine

## 2011-12-21 VITALS — BP 123/82 | HR 73 | Ht 61.0 in | Wt 188.0 lb

## 2011-12-21 DIAGNOSIS — J309 Allergic rhinitis, unspecified: Secondary | ICD-10-CM

## 2011-12-21 DIAGNOSIS — J302 Other seasonal allergic rhinitis: Secondary | ICD-10-CM

## 2011-12-21 DIAGNOSIS — Z8669 Personal history of other diseases of the nervous system and sense organs: Secondary | ICD-10-CM

## 2011-12-21 DIAGNOSIS — Z23 Encounter for immunization: Secondary | ICD-10-CM

## 2011-12-21 DIAGNOSIS — M1990 Unspecified osteoarthritis, unspecified site: Secondary | ICD-10-CM

## 2011-12-21 DIAGNOSIS — G43909 Migraine, unspecified, not intractable, without status migrainosus: Secondary | ICD-10-CM

## 2011-12-21 MED ORDER — TRAMADOL HCL 50 MG PO TABS: 50.0000 mg | ORAL_TABLET | Freq: Three times a day (TID) | ORAL | Status: DC | PRN

## 2011-12-21 MED ORDER — SUMATRIPTAN SUCCINATE 100 MG PO TABS: 100.0000 mg | ORAL_TABLET | Freq: Once | ORAL | Status: AC | PRN

## 2011-12-21 MED ORDER — FLUTICASONE PROPIONATE 50 MCG/ACT NA SUSP: 2.0000 | Freq: Every day | NASAL | Status: DC

## 2011-12-21 MED ORDER — NAPROXEN SODIUM 550 MG PO TABS: 550.0000 mg | ORAL_TABLET | Freq: Two times a day (BID) | ORAL | Status: DC

## 2011-12-21 MED ORDER — MONTELUKAST SODIUM 10 MG PO TABS: 10.0000 mg | ORAL_TABLET | Freq: Every day | ORAL | Status: DC

## 2011-12-21 NOTE — Patient Instructions (Signed)
Smoking Cessation, Tips for Success YOU CAN QUIT SMOKING If you are ready to quit smoking, congratulations! You have chosen to help yourself be healthier. Cigarettes bring nicotine, tar, carbon monoxide, and other irritants into your body. Your lungs, heart, and blood vessels will be able to work better without these poisons. There are many different ways to quit smoking. Nicotine gum, nicotine patches, a nicotine inhaler, or nicotine nasal spray can help with physical craving. Hypnosis, support groups, and medicines help break the habit of smoking. Here are some tips to help you quit for good.  Throw away all cigarettes.   Clean and remove all ashtrays from your home, work, and car.   On a card, write down your reasons for quitting. Carry the card with you and read it when you get the urge to smoke.   Cleanse your body of nicotine. Drink enough water and fluids to keep your urine clear or pale yellow. Do this after quitting to flush the nicotine from your body.   Learn to predict your moods. Do not let a bad situation be your excuse to have a cigarette. Some situations in your life might tempt you into wanting a cigarette.   Never have "just one" cigarette. It leads to wanting another and another. Remind yourself of your decision to quit.   Change habits associated with smoking. If you smoked while driving or when feeling stressed, try other activities to replace smoking. Stand up when drinking your coffee. Brush your teeth after eating. Sit in a different chair when you read the paper. Avoid alcohol while trying to quit, and try to drink fewer caffeinated beverages. Alcohol and caffeine may urge you to smoke.   Avoid foods and drinks that can trigger a desire to smoke, such as sugary or spicy foods and alcohol.   Ask people who smoke not to smoke around you.   Have something planned to do right after eating or having a cup of coffee. Take a walk or exercise to perk you up. This will help to  keep you from overeating.   Try a relaxation exercise to calm you down and decrease your stress. Remember, you may be tense and nervous for the first 2 weeks after you quit, but this will pass.   Find new activities to keep your hands busy. Play with a pen, coin, or rubber band. Doodle or draw things on paper.   Brush your teeth right after eating. This will help cut down on the craving for the taste of tobacco after meals. You can try mouthwash, too.   Use oral substitutes, such as lemon drops, carrots, a cinnamon stick, or chewing gum, in place of cigarettes. Keep them handy so they are available when you have the urge to smoke.   When you have the urge to smoke, try deep breathing.   Designate your home as a nonsmoking area.   If you are a heavy smoker, ask your caregiver about a prescription for nicotine chewing gum. It can ease your withdrawal from nicotine.   Reward yourself. Set aside the cigarette money you save and buy yourself something nice.   Look for support from others. Join a support group or smoking cessation program. Ask someone at home or at work to help you with your plan to quit smoking.   Always ask yourself, "Do I need this cigarette or is this just a reflex?" Tell yourself, "Today, I choose not to smoke," or "I do not want to smoke." You are   reminding yourself of your decision to quit, even if you do smoke a cigarette.  HOW WILL I FEEL WHEN I QUIT SMOKING?  The benefits of not smoking start within days of quitting.   You may have symptoms of withdrawal because your body is used to nicotine (the addictive substance in cigarettes). You may crave cigarettes, be irritable, feel very hungry, cough often, get headaches, or have difficulty concentrating.   The withdrawal symptoms are only temporary. They are strongest when you first quit but will go away within 10 to 14 days.   When withdrawal symptoms occur, stay in control. Think about your reasons for quitting. Remind  yourself that these are signs that your body is healing and getting used to being without cigarettes.   Remember that withdrawal symptoms are easier to treat than the major diseases that smoking can cause.   Even after the withdrawal is over, expect periodic urges to smoke. However, these cravings are generally short-lived and will go away whether you smoke or not. Do not smoke!   If you relapse and smoke again, do not lose hope. Most smokers quit 3 times before they are successful.   If you relapse, do not give up! Plan ahead and think about what you will do the next time you get the urge to smoke.  LIFE AS A NONSMOKER: MAKE IT FOR A MONTH, MAKE IT FOR LIFE Day 1: Hang this page where you will see it every day. Day 2: Get rid of all ashtrays, matches, and lighters. Day 3: Drink water. Breathe deeply between sips. Day 4: Avoid places with smoke-filled air, such as bars, clubs, or the smoking section of restaurants. Day 5: Keep track of how much money you save by not smoking. Day 6: Avoid boredom. Keep a good book with you or go to the movies. Day 7: Reward yourself! One week without smoking! Day 8: Make a dental appointment to get your teeth cleaned. Day 9: Decide how you will turn down a cigarette before it is offered to you. Day 10: Review your reasons for quitting. Day 11: Distract yourself. Stay active to keep your mind off smoking and to relieve tension. Take a walk, exercise, read a book, do a crossword puzzle, or try a new hobby. Day 12: Exercise. Get off the bus before your stop or use stairs instead of escalators. Day 13: Call on friends for support and encouragement. Day 14: Reward yourself! Two weeks without smoking! Day 15: Practice deep breathing exercises. Day 16: Bet a friend that you can stay a nonsmoker. Day 17: Ask to sit in nonsmoking sections of restaurants. Day 18: Hang up "No Smoking" signs. Day 19: Think of yourself as a nonsmoker. Day 20: Each morning, tell  yourself you will not smoke. Day 21: Reward yourself! Three weeks without smoking! Day 22: Think of smoking in negative ways. Remember how it stains your teeth, gives you bad breath, and leaves you short of breath. Day 23: Eat a nutritious breakfast. Day 24:Do not relive your days as a smoker. Day 25: Hold a pencil in your hand when talking on the telephone. Day 26: Tell all your friends you do not smoke. Day 27: Think about how much better food tastes. Day 28: Remember, one cigarette is one too many. Day 29: Take up a hobby that will keep your hands busy. Day 30: Congratulations! One month without smoking! Give yourself a big reward. Your caregiver can direct you to community resources or hospitals for support, which   may include:  Group support.   Education.   Hypnosis.   Subliminal therapy.  Document Released: 05/14/2004 Document Revised: 08/05/2011 Document Reviewed: 06/02/2009 [employer] Patient Information 2012 [employer].Smoking Cessation This document explains the best ways for you to quit smoking and new treatments to help. It lists new medicines that can double or triple your chances of quitting and quitting for good. It also considers ways to avoid relapses and concerns you may have about quitting, including weight gain. NICOTINE: A POWERFUL ADDICTION If you have tried to quit smoking, you know how hard it can be. It is hard because nicotine is a very addictive drug. For some people, it can be as addictive as heroin or cocaine. Usually, people make 2 or 3 tries, or more, before finally being able to quit. Each time you try to quit, you can learn about what helps and what hurts. Quitting takes hard work and a lot of effort, but you can quit smoking. QUITTING SMOKING IS ONE OF THE MOST IMPORTANT THINGS YOU WILL EVER DO.  You will live longer, feel better, and live better.   The impact on your body of quitting smoking is felt almost immediately:   Within 20 minutes, blood  pressure decreases. Pulse returns to its normal level.   After 8 hours, carbon monoxide levels in the blood return to normal. Oxygen level increases.   After 24 hours, chance of heart attack starts to decrease. Breath, hair, and body stop smelling like smoke.   After 48 hours, damaged nerve endings begin to recover. Sense of taste and smell improve.   After 72 hours, the body is virtually free of nicotine. Bronchial tubes relax and breathing becomes easier.   After 2 to 12 weeks, lungs can hold more air. Exercise becomes easier and circulation improves.   Quitting will reduce your risk of having a heart attack, stroke, cancer, or lung disease:   After 1 year, the risk of coronary heart disease is cut in half.   After 5 years, the risk of stroke falls to the same as a nonsmoker.   After 10 years, the risk of lung cancer is cut in half and the risk of other cancers decreases significantly.   After 15 years, the risk of coronary heart disease drops, usually to the level of a nonsmoker.   If you are pregnant, quitting smoking will improve your chances of having a healthy baby.   The people you live with, especially your children, will be healthier.   You will have extra money to spend on things other than cigarettes.  FIVE KEYS TO QUITTING Studies have shown that these 5 steps will help you quit smoking and quit for good. You have the best chances of quitting if you use them together: 1. Get ready.  2. Get support and encouragement.  3. Learn new skills and behaviors.  4. Get medicine to reduce your nicotine addiction and use it correctly.  5. Be prepared for relapse or difficult situations. Be determined to continue trying to quit, even if you do not succeed at first.  1. GET READY  Set a quit date.   Change your environment.   Get rid of ALL cigarettes, ashtrays, matches, and lighters in your home, car, and place of work.   Do not let people smoke in your home.   Review your  past attempts to quit. Think about what worked and what did not.   Once you quit, do not smoke. NOT EVEN A PUFF!    2. GET SUPPORT AND ENCOURAGEMENT Studies have shown that you have a better chance of being successful if you have help. You can get support in many ways.  Tell your family, friends, and coworkers that you are going to quit and need their support. Ask them not to smoke around you.   Talk to your caregivers (doctor, dentist, nurse, pharmacist, psychologist, and/or smoking counselor).   Get individual, group, or telephone counseling and support. The more counseling you have, the better your chances are of quitting. Programs are available at local hospitals and health centers. Call your local health department for information about programs in your area.   Spiritual beliefs and practices may help some smokers quit.   Quit meters are small computer programs online or downloadable that keep track of quit statistics, such as amount of "quit-time," cigarettes not smoked, and money saved.   Many smokers find one or more of the many self-help books available useful in helping them quit and stay off tobacco.  3. LEARN NEW SKILLS AND BEHAVIORS  Try to distract yourself from urges to smoke. Talk to someone, go for a walk, or occupy your time with a task.   When you first try to quit, change your routine. Take a different route to work. Drink tea instead of coffee. Eat breakfast in a different place.   Do something to reduce your stress. Take a hot bath, exercise, or read a book.   Plan something enjoyable to do every day. Reward yourself for not smoking.   Explore interactive web-based programs that specialize in helping you quit.  4. GET MEDICINE AND USE IT CORRECTLY Medicines can help you stop smoking and decrease the urge to smoke. Combining medicine with the above behavioral methods and support can quadruple your chances of successfully quitting smoking. The [place] Food and Drug  Administration (FDA) has approved 7 medicines to help you quit smoking. These medicines fall into 3 categories.  Nicotine replacement therapy (delivers nicotine to your body without the negative effects and risks of smoking):   Nicotine gum: Available over-the-counter.   Nicotine lozenges: Available over-the-counter.   Nicotine inhaler: Available by prescription.   Nicotine nasal spray: Available by prescription.   Nicotine skin patches (transdermal): Available by prescription and over-the-counter.   Antidepressant medicine (helps people abstain from smoking, but how this works is unknown):   Bupropion sustained-release (SR) tablets: Available by prescription.   Nicotinic receptor partial agonist (simulates the effect of nicotine in your brain):   Varenicline tartrate tablets: Available by prescription.   Ask your caregiver for advice about which medicines to use and how to use them. Carefully read the information on the package.   Everyone who is trying to quit may benefit from using a medicine. If you are pregnant or trying to become pregnant, nursing an infant, you are under age 18, or you smoke fewer than 10 cigarettes per day, talk to your caregiver before taking any nicotine replacement medicines.   You should stop using a nicotine replacement product and call your caregiver if you experience nausea, dizziness, weakness, vomiting, fast or irregular heartbeat, mouth problems with the lozenge or gum, or redness or swelling of the skin around the patch that does not go away.   Do not use any other product containing nicotine while using a nicotine replacement product.   Talk to your caregiver before using these products if you have diabetes, heart disease, asthma, stomach ulcers, you had a recent heart attack, you have high blood pressure   that is not controlled with medicine, a history of irregular heartbeat, or you have been prescribed medicine to help you quit smoking.  5. BE  PREPARED FOR RELAPSE OR DIFFICULT SITUATIONS  Most relapses occur within the first 3 months after quitting. Do not be discouraged if you start smoking again. Remember, most people try several times before they finally quit.   You may have symptoms of withdrawal because your body is used to nicotine. You may crave cigarettes, be irritable, feel very hungry, cough often, get headaches, or have difficulty concentrating.   The withdrawal symptoms are only temporary. They are strongest when you first quit, but they will go away within 10 to 14 days.  Here are some difficult situations to watch for:  Alcohol. Avoid drinking alcohol. Drinking lowers your chances of successfully quitting.   Caffeine. Try to reduce the amount of caffeine you consume. It also lowers your chances of successfully quitting.   Other smokers. Being around smoking can make you want to smoke. Avoid smokers.   Weight gain. Many smokers will gain weight when they quit, usually less than 10 pounds. Eat a healthy diet and stay active. Do not let weight gain distract you from your main goal, quitting smoking. Some medicines that help you quit smoking may also help delay weight gain. You can always lose the weight gained after you quit.   Bad mood or depression. There are a lot of ways to improve your mood other than smoking.  If you are having problems with any of these situations, talk to your caregiver. SPECIAL SITUATIONS AND CONDITIONS Studies suggest that everyone can quit smoking. Your situation or condition can give you a special reason to quit.  Pregnant women/new mothers: By quitting, you protect your baby's health and your own.   Hospitalized patients: By quitting, you reduce health problems and help healing.   Heart attack patients: By quitting, you reduce your risk of a second heart attack.   Lung, head, and neck cancer patients: By quitting, you reduce your chance of a second cancer.   Parents of children and  adolescents: By quitting, you protect your children from illnesses caused by secondhand smoke.  QUESTIONS TO THINK ABOUT Think about the following questions before you try to stop smoking. You may want to talk about your answers with your caregiver.  Why do you want to quit?   If you tried to quit in the past, what helped and what did not?   What will be the most difficult situations for you after you quit? How will you plan to handle them?   Who can help you through the tough times? Your family? Friends? Caregiver?   What pleasures do you get from smoking? What ways can you still get pleasure if you quit?  Here are some questions to ask your caregiver:  How can you help me to be successful at quitting?   What medicine do you think would be best for me and how should I take it?   What should I do if I need more help?   What is smoking withdrawal like? How can I get information on withdrawal?  Quitting takes hard work and a lot of effort, but you can quit smoking. FOR MORE INFORMATION  Smokefree.gov (http://www.smokefree.gov) provides free, accurate, evidence-based information and professional assistance to help support the immediate and long-term needs of people trying to quit smoking. Document Released: 08/10/2001 Document Revised: 08/05/2011 Document Reviewed: 06/02/2009 [employer] Patient Information 2012 [employer]. 

## 2011-12-21 NOTE — Progress Notes (Signed)
  Subjective:    Patient ID: Katherine Beck, female    DOB: 02-23-1956, 56 y.o.   MRN: [id number]  Headache  This is a chronic problem. Episode onset: 5 yearsago. Had Ct scan done which was negative. The problem occurs seasonly (almost daily in the spring ). The problem has been unchanged. The pain is located in the frontal and bilateral region. The pain quality is similar to prior headaches. The quality of the pain is described as squeezing. The pain is at a severity of 5/10. The pain is moderate. Associated symptoms include coughing, dizziness, rhinorrhea, sinus pressure, tinnitus and weakness. Pertinent negatives include no abdominal pain, abnormal behavior, blurred vision, drainage, ear pain, eye pain, nausea, neck pain, numbness, sore throat, visual change, vomiting or weight loss. The symptoms are aggravated by weather changes (pollen). She has tried triptans and NSAIDs (nasal spray) for the symptoms. The treatment provided moderate relief. Her past medical history is significant for migraine headaches, migraines in the family and obesity. There is no history of hypertension, recent head traumas or TMJ.   #2 she also reports not being interested at this time and stopping her smoking. States that or uses the excuse that she is unable to stop smoking due to her husband's smoking around her. #3 chronic arthritic pain. She uses Naprosyn and tramadol up to 3 times a day to control her arthritic pain she was a renewal of her medications.    Review of Systems  Constitutional: Negative for weight loss.  HENT: Positive for rhinorrhea, sinus pressure and tinnitus. Negative for ear pain, sore throat and neck pain.   Eyes: Negative for blurred vision and pain.  Respiratory: Positive for cough.   Gastrointestinal: Negative for nausea, vomiting and abdominal pain.  Neurological: Positive for dizziness, weakness and headaches. Negative for numbness.      BP 123/82  Pulse 73  Ht 5\' 1"  (1.549 m)  Wt 188 lb  (85.276 kg)  BMI 35.52 kg/m2  SpO2 98% Objective:   Physical Exam  Constitutional: She is oriented to person, place, and time. She appears well-developed and well-nourished. No distress.  HENT:  Head: Normocephalic and atraumatic.  Eyes: Pupils are equal, round, and reactive to light.  Neck: Normal range of motion. Neck supple. No thyromegaly present.  Cardiovascular: Normal rate, regular rhythm and normal heart sounds.  Exam reveals no friction rub.   No murmur heard. Pulmonary/Chest: Effort normal. No respiratory distress. She has wheezes. She has no rales.  Musculoskeletal: Normal range of motion.  Neurological: She is alert and oriented to person, place, and time. No cranial nerve deficit.  Skin: Skin is warm and dry. She is not diaphoretic.  Psychiatric: Her behavior is normal.      Assessment & Plan:  #1 migraines. Will renew her her Imitrex. #2 seasonal allergies. Renew Flonase. Since anti-histamines did not work in the past Singulair did for a while we'll renew her Singulair at this time #3 tobacco/ smoking cessation counseling. #4 arthritic pain renew tramadol and Naprosyn. #5 immunization needs will administer DTaP for today.

## 2012-01-11 ENCOUNTER — Ambulatory Visit (HOSPITAL_COMMUNITY): Payer: Self-pay | Admitting: Psychiatry

## 2012-01-11 ENCOUNTER — Other Ambulatory Visit (HOSPITAL_COMMUNITY): Payer: Self-pay

## 2012-01-11 DIAGNOSIS — F319 Bipolar disorder, unspecified: Secondary | ICD-10-CM

## 2012-01-11 MED ORDER — TRAZODONE HCL 150 MG PO TABS: 150.0000 mg | ORAL_TABLET | Freq: Every day | ORAL | Status: DC

## 2012-01-11 MED ORDER — ESCITALOPRAM OXALATE 20 MG PO TABS: 20.0000 mg | ORAL_TABLET | Freq: Every day | ORAL | Status: DC

## 2012-01-11 MED ORDER — RISPERIDONE 1 MG PO TABS: 1.0000 mg | ORAL_TABLET | Freq: Two times a day (BID) | ORAL | Status: DC

## 2012-01-11 NOTE — Telephone Encounter (Signed)
Called patient.  She is currently taking escitalopram, risperidone, and trazodone.  The patient denies any SI/HI/AVH or medication side effects.   PLAN: Will send in refills for the above listed medications- 30 day supply with one refill.

## 2012-01-13 ENCOUNTER — Ambulatory Visit (HOSPITAL_COMMUNITY): Payer: Self-pay | Admitting: Licensed Clinical Social Worker

## 2012-01-13 ENCOUNTER — Telehealth (HOSPITAL_COMMUNITY): Payer: Self-pay

## 2012-01-13 DIAGNOSIS — F319 Bipolar disorder, unspecified: Secondary | ICD-10-CM

## 2012-01-13 MED ORDER — TRAZODONE HCL 150 MG PO TABS: ORAL_TABLET | ORAL | Status: DC

## 2012-01-13 NOTE — Telephone Encounter (Signed)
Called pharmacy to clarify trazodone prescription.

## 2012-02-10 ENCOUNTER — Telehealth (HOSPITAL_COMMUNITY): Payer: Self-pay | Admitting: Psychiatry

## 2012-02-10 NOTE — Telephone Encounter (Signed)
Called patient to inform her that prescriptions for phenytoin and meclizine were sent to this office. I have also asked the patient and informed the pharmacy  that she needs to schedule another appointment before receiving refills of medications.

## 2012-02-14 ENCOUNTER — Other Ambulatory Visit: Payer: Self-pay | Admitting: *Deleted

## 2012-02-14 ENCOUNTER — Other Ambulatory Visit (HOSPITAL_COMMUNITY): Payer: Self-pay | Admitting: Psychiatry

## 2012-02-14 DIAGNOSIS — F319 Bipolar disorder, unspecified: Secondary | ICD-10-CM

## 2012-02-14 MED ORDER — MECLIZINE HCL 25 MG PO TABS: 25.0000 mg | ORAL_TABLET | Freq: Three times a day (TID) | ORAL | Status: DC | PRN

## 2012-02-14 MED ORDER — ESCITALOPRAM OXALATE 20 MG PO TABS: 20.0000 mg | ORAL_TABLET | Freq: Every day | ORAL | Status: DC

## 2012-02-14 MED ORDER — PHENYTOIN SODIUM EXTENDED 100 MG PO CAPS: 100.0000 mg | ORAL_CAPSULE | Freq: Three times a day (TID) | ORAL | Status: DC

## 2012-02-22 ENCOUNTER — Ambulatory Visit (INDEPENDENT_AMBULATORY_CARE_PROVIDER_SITE_OTHER): Payer: Medicare Other | Admitting: Family Medicine

## 2012-02-22 ENCOUNTER — Encounter: Payer: Self-pay | Admitting: Family Medicine

## 2012-02-22 VITALS — BP 115/86 | HR 70 | Ht 61.0 in | Wt 190.0 lb

## 2012-02-22 DIAGNOSIS — F17200 Nicotine dependence, unspecified, uncomplicated: Secondary | ICD-10-CM

## 2012-02-22 DIAGNOSIS — R42 Dizziness and giddiness: Secondary | ICD-10-CM

## 2012-02-22 DIAGNOSIS — Z716 Tobacco abuse counseling: Secondary | ICD-10-CM

## 2012-02-22 DIAGNOSIS — J449 Chronic obstructive pulmonary disease, unspecified: Secondary | ICD-10-CM

## 2012-02-22 DIAGNOSIS — Z7189 Other specified counseling: Secondary | ICD-10-CM

## 2012-02-22 MED ORDER — TIOTROPIUM BROMIDE MONOHYDRATE 18 MCG IN CAPS: 18.0000 ug | ORAL_CAPSULE | Freq: Every day | RESPIRATORY_TRACT | Status: AC

## 2012-02-22 MED ORDER — FLUTICASONE-SALMETEROL 500-50 MCG/DOSE IN AEPB: 1.0000 | INHALATION_SPRAY | Freq: Two times a day (BID) | RESPIRATORY_TRACT | Status: DC

## 2012-02-22 MED ORDER — MECLIZINE HCL 25 MG PO TABS: 25.0000 mg | ORAL_TABLET | Freq: Three times a day (TID) | ORAL | Status: DC | PRN

## 2012-02-22 NOTE — Progress Notes (Signed)
  Subjective:    Patient ID: Katherine Beck, female    DOB: Sep 24, 1955, 56 y.o.   MRN: [id number]  HPI  Patient reports since being on spriva her breathing has improved she wants to get a refill of that and refill her Advair discus prescription. #2 vertigo she reports taking the Antivert 3 times a day almost everyday. She has had full neurological workup is still has difficulty with dizziness on daily basis. She usually uses 90 tablets in a month's time. #3 tobacco abuse unfortunately she is still smoking has not really made a strong effort to stop. #4 headaches are doing much better since being placed on the Imitrex.  Review of Systems  Constitutional: Negative.   All other systems reviewed and are negative.    BP 115/86  Pulse 70  Ht 5\' 1"  (1.549 m)  Wt 190 lb (86.183 kg)  BMI 35.90 kg/m2  SpO2 96% Objective:   Physical Exam  Constitutional: She is oriented to person, place, and time. She appears well-developed and well-nourished.  HENT:  Head: Normocephalic.  Neck: Normal range of motion. Neck supple.  Cardiovascular: Normal rate, regular rhythm and normal heart sounds.   Pulmonary/Chest: Effort normal.  Neurological: She is alert and oriented to person, place, and time.  Skin: Skin is warm and dry.  Psychiatric: She has a normal mood and affect.      Assessment & Plan:  #1vertigo will change Meclizine to 90 tablets 3 times a day a regular basis.  #2 COPD. Renew her spriva prescription and Advair Diskus. #3 headaches. Continue current therapy and medication.  #4 tobacco abuse once again encouragement offered of medication and other help aids to help her stop smoking when she is ready. Return 6 months for yearly examination.

## 2012-02-22 NOTE — Patient Instructions (Signed)
Smoking Cessation, Tips for Success YOU CAN QUIT SMOKING If you are ready to quit smoking, congratulations! You have chosen to help yourself be healthier. Cigarettes bring nicotine, tar, carbon monoxide, and other irritants into your body. Your lungs, heart, and blood vessels will be able to work better without these poisons. There are many different ways to quit smoking. Nicotine gum, nicotine patches, a nicotine inhaler, or nicotine nasal spray can help with physical craving. Hypnosis, support groups, and medicines help break the habit of smoking. Here are some tips to help you quit for good. Throw away all cigarettes.  Clean and remove all ashtrays from your home, work, and car.  On a card, write down your reasons for quitting. Carry the card with you and read it when you get the urge to smoke.  Cleanse your body of nicotine. Drink enough water and fluids to keep your urine clear or pale yellow. Do this after quitting to flush the nicotine from your body.  Learn to predict your moods. Do not let a bad situation be your excuse to have a cigarette. Some situations in your life might tempt you into wanting a cigarette.  Never have "just one" cigarette. It leads to wanting another and another. Remind yourself of your decision to quit.  Change habits associated with smoking. If you smoked while driving or when feeling stressed, try other activities to replace smoking. Stand up when drinking your coffee. Brush your teeth after eating. Sit in a different chair when you read the paper. Avoid alcohol while trying to quit, and try to drink fewer caffeinated beverages. Alcohol and caffeine may urge you to smoke.  Avoid foods and drinks that can trigger a desire to smoke, such as sugary or spicy foods and alcohol.  Ask people who smoke not to smoke around you.  Have something planned to do right after eating or having a cup of coffee. Take a walk or exercise to perk you up. This will help to keep you from  overeating.  Try a relaxation exercise to calm you down and decrease your stress. Remember, you may be tense and nervous for the first 2 weeks after you quit, but this will pass.  Find new activities to keep your hands busy. Play with a pen, coin, or rubber band. Doodle or draw things on paper.  Brush your teeth right after eating. This will help cut down on the craving for the taste of tobacco after meals. You can try mouthwash, too.  Use oral substitutes, such as lemon drops, carrots, a cinnamon stick, or chewing gum, in place of cigarettes. Keep them handy so they are available when you have the urge to smoke.  When you have the urge to smoke, try deep breathing.  Designate your home as a nonsmoking area.  If you are a heavy smoker, ask your caregiver about a prescription for nicotine chewing gum. It can ease your withdrawal from nicotine.  Reward yourself. Set aside the cigarette money you save and buy yourself something nice.  Look for support from others. Join a support group or smoking cessation program. Ask someone at home or at work to help you with your plan to quit smoking.  Always ask yourself, "Do I need this cigarette or is this just a reflex?" Tell yourself, "Today, I choose not to smoke," or "I do not want to smoke." You are reminding yourself of your decision to quit, even if you do smoke a cigarette.  HOW WILL I FEEL WHEN  I QUIT SMOKING? The benefits of not smoking start within days of quitting.  You may have symptoms of withdrawal because your body is used to nicotine (the addictive substance in cigarettes). You may crave cigarettes, be irritable, feel very hungry, cough often, get headaches, or have difficulty concentrating.  The withdrawal symptoms are only temporary. They are strongest when you first quit but will go away within 10 to 14 days.  When withdrawal symptoms occur, stay in control. Think about your reasons for quitting. Remind yourself that these are signs that your  body is healing and getting used to being without cigarettes.  Remember that withdrawal symptoms are easier to treat than the major diseases that smoking can cause.  Even after the withdrawal is over, expect periodic urges to smoke. However, these cravings are generally short-lived and will go away whether you smoke or not. Do not smoke!  If you relapse and smoke again, do not lose hope. Most smokers quit 3 times before they are successful.  If you relapse, do not give up! Plan ahead and think about what you will do the next time you get the urge to smoke.  LIFE AS A NONSMOKER: MAKE IT FOR A MONTH, MAKE IT FOR LIFE Day 1: Hang this page where you will see it every day. Day 2: Get rid of all ashtrays, matches, and lighters. Day 3: Drink water. Breathe deeply between sips. Day 4: Avoid places with smoke-filled air, such as bars, clubs, or the smoking section of restaurants. Day 5: Keep track of how much money you save by not smoking. Day 6: Avoid boredom. Keep a good book with you or go to the movies. Day 7: Reward yourself! One week without smoking! Day 8: Make a dental appointment to get your teeth cleaned. Day 9: Decide how you will turn down a cigarette before it is offered to you. Day 10: Review your reasons for quitting. Day 11: Distract yourself. Stay active to keep your mind off smoking and to relieve tension. Take a walk, exercise, read a book, do a crossword puzzle, or try a new hobby. Day 12: Exercise. Get off the bus before your stop or use stairs instead of escalators. Day 13: Call on friends for support and encouragement. Day 14: Reward yourself! Two weeks without smoking! Day 15: Practice deep breathing exercises. Day 16: Bet a friend that you can stay a nonsmoker. Day 17: Ask to sit in nonsmoking sections of restaurants. Day 18: Hang up "No Smoking" signs. Day 19: Think of yourself as a nonsmoker. Day 20: Each morning, tell yourself you will not smoke. Day 21: Reward yourself!  Three weeks without smoking! Day 22: Think of smoking in negative ways. Remember how it stains your teeth, gives you bad breath, and leaves you short of breath. Day 23: Eat a nutritious breakfast. Day 24:Do not relive your days as a smoker. Day 25: Hold a pencil in your hand when talking on the telephone. Day 26: Tell all your friends you do not smoke. Day 27: Think about how much better food tastes. Day 28: Remember, one cigarette is one too many. Day 29: Take up a hobby that will keep your hands busy. Day 30: Congratulations! One month without smoking! Give yourself a big reward. Your caregiver can direct you to community resources or hospitals for support, which may include: Group support.  Education.  Hypnosis.  Subliminal therapy.  Document Released: 05/14/2004 Document Revised: 08/05/2011 Document Reviewed: 06/02/2009 [facility] Patient Information 2012 [place], [place].Chronic Obstructive  Pulmonary Disease Chronic obstructive pulmonary disease (COPD) is a condition in which airflow from the lungs is restricted. The lungs can never return to normal, but there are measures you can take which will improve them and make you feel better. CAUSES   Smoking.   Exposure to secondhand smoke.   Breathing in irritants (pollution, cigarette smoke, strong smells, aerosol sprays, paint fumes).   History of lung infections.  TREATMENT  Treatment focuses on making you comfortable (supportive care). Your caregiver may prescribe medications (inhaled or pills) to help improve your breathing. HOME CARE INSTRUCTIONS   If you smoke, stop smoking.   Avoid exposure to smoke, chemicals, and fumes that aggravate your breathing.   Take antibiotic medicines as directed by your caregiver.   Avoid medicines that dry up your system and slow down the elimination of secretions (antihistamines and cough syrups). This decreases respiratory capacity and may lead to infections.   Drink enough water and fluids  to keep your urine clear or pale yellow. This loosens secretions.   Use humidifiers at home and at your bedside if they do not make breathing difficult.   Receive all protective vaccines your caregiver suggests, especially pneumococcal and influenza.   Use home oxygen as suggested.   Stay active. Exercise and physical activity will help maintain your ability to do things you want to do.   Eat a healthy diet.  SEEK MEDICAL CARE IF:   You develop pus-like mucus (sputum).   Breathing is more labored or exercise becomes difficult to do.   You are running out of the medicine you take for your breathing.  SEEK IMMEDIATE MEDICAL CARE IF:   You have a rapid heart rate.   You have agitation, confusion, tremors, or are in a stupor (family members may need to observe this).   It becomes difficult to breathe.   You develop chest pain.   You have a fever.  MAKE SURE YOU:   Understand these instructions.   Will watch your condition.   Will get help right away if you are not doing well or get worse.  Document Released: 05/26/2005 Document Revised: 08/05/2011 Document Reviewed: 10/16/2010 [facility] Patient Information 2012 [employer] [place] [employer], [place].

## 2012-02-24 ENCOUNTER — Encounter (HOSPITAL_COMMUNITY): Payer: Self-pay | Admitting: Psychiatry

## 2012-02-24 ENCOUNTER — Ambulatory Visit (INDEPENDENT_AMBULATORY_CARE_PROVIDER_SITE_OTHER): Payer: Medicare Other | Admitting: Psychiatry

## 2012-02-24 VITALS — BP 100/70 | HR 75 | Ht 61.0 in | Wt 190.0 lb

## 2012-02-24 DIAGNOSIS — F1210 Cannabis abuse, uncomplicated: Secondary | ICD-10-CM

## 2012-02-24 DIAGNOSIS — F319 Bipolar disorder, unspecified: Secondary | ICD-10-CM

## 2012-02-24 DIAGNOSIS — F3130 Bipolar disorder, current episode depressed, mild or moderate severity, unspecified: Secondary | ICD-10-CM

## 2012-02-24 MED ORDER — ESCITALOPRAM OXALATE 20 MG PO TABS: 20.0000 mg | ORAL_TABLET | Freq: Every day | ORAL | Status: DC

## 2012-02-24 MED ORDER — RISPERIDONE 2 MG PO TABS: 1.0000 mg | ORAL_TABLET | Freq: Two times a day (BID) | ORAL | Status: DC

## 2012-02-24 MED ORDER — TRAZODONE HCL 150 MG PO TABS: ORAL_TABLET | ORAL | Status: DC
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
## 2012-03-23 ENCOUNTER — Ambulatory Visit (HOSPITAL_COMMUNITY): Payer: Self-pay | Admitting: Psychiatry

## 2012-03-24 ENCOUNTER — Telehealth: Payer: Self-pay | Admitting: *Deleted

## 2012-03-24 ENCOUNTER — Telehealth (HOSPITAL_COMMUNITY): Payer: Self-pay | Admitting: Psychiatry

## 2012-03-24 MED ORDER — PANTOPRAZOLE SODIUM 40 MG PO TBEC: 40.0000 mg | DELAYED_RELEASE_TABLET | Freq: Every day | ORAL | Status: DC

## 2012-03-24 NOTE — Telephone Encounter (Signed)
Patient called. She states that she has been out of protonix for one week and has been having abdominal pain for for several day. She asked if this provider would give her a prescription.  PLAN: I have asked that patient to call or go to her PCP's office to have this medication filled and her abdominal pain evaluated.

## 2012-03-24 NOTE — Telephone Encounter (Signed)
Okay for 1 month supply with 2 refills.

## 2012-03-24 NOTE — Telephone Encounter (Signed)
Pt is calling in tears wanting [place] to fill protonix for her. She has seen Dr. Thurmond Butts but we have never filled it. Please advise if I can send rx to pharmacy. It is on med list.

## 2012-03-29 ENCOUNTER — Other Ambulatory Visit: Payer: Self-pay | Admitting: Family Medicine

## 2012-03-30 ENCOUNTER — Ambulatory Visit (HOSPITAL_COMMUNITY): Payer: Self-pay | Admitting: Psychiatry

## 2012-04-06 ENCOUNTER — Ambulatory Visit (INDEPENDENT_AMBULATORY_CARE_PROVIDER_SITE_OTHER): Payer: Medicare Other | Admitting: Psychiatry

## 2012-04-06 ENCOUNTER — Encounter (HOSPITAL_COMMUNITY): Payer: Self-pay | Admitting: Psychiatry

## 2012-04-06 VITALS — BP 100/70 | HR 67 | Ht 61.0 in | Wt 193.0 lb

## 2012-04-06 DIAGNOSIS — F319 Bipolar disorder, unspecified: Secondary | ICD-10-CM

## 2012-04-06 DIAGNOSIS — F3130 Bipolar disorder, current episode depressed, mild or moderate severity, unspecified: Secondary | ICD-10-CM

## 2012-04-06 DIAGNOSIS — F1210 Cannabis abuse, uncomplicated: Secondary | ICD-10-CM

## 2012-04-06 DIAGNOSIS — Z5181 Encounter for therapeutic drug level monitoring: Secondary | ICD-10-CM

## 2012-04-06 MED ORDER — RISPERIDONE 2 MG PO TABS: 2.0000 mg | ORAL_TABLET | Freq: Every day | ORAL | Status: DC

## 2012-04-06 MED ORDER — ESCITALOPRAM OXALATE 20 MG PO TABS: 20.0000 mg | ORAL_TABLET | Freq: Every day | ORAL | Status: AC

## 2012-04-06 MED ORDER — TRAZODONE HCL 150 MG PO TABS: ORAL_TABLET | ORAL | Status: DC
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
## 2012-05-04 ENCOUNTER — Ambulatory Visit (HOSPITAL_COMMUNITY): Payer: Self-pay | Admitting: Psychiatry

## 2012-05-25 ENCOUNTER — Encounter (HOSPITAL_COMMUNITY): Payer: Self-pay | Admitting: Psychiatry

## 2012-05-25 IMAGING — CR DG ANKLE COMPLETE 3+V*L*
3 series · 3 of 3 positions shown · non-contrast
Comparison: None

CLINICAL DATA: Fall, twisted ankle, pain, limited mobility

LEFT ANKLE COMPLETE - 3+ VIEW

[x ankle obl left]
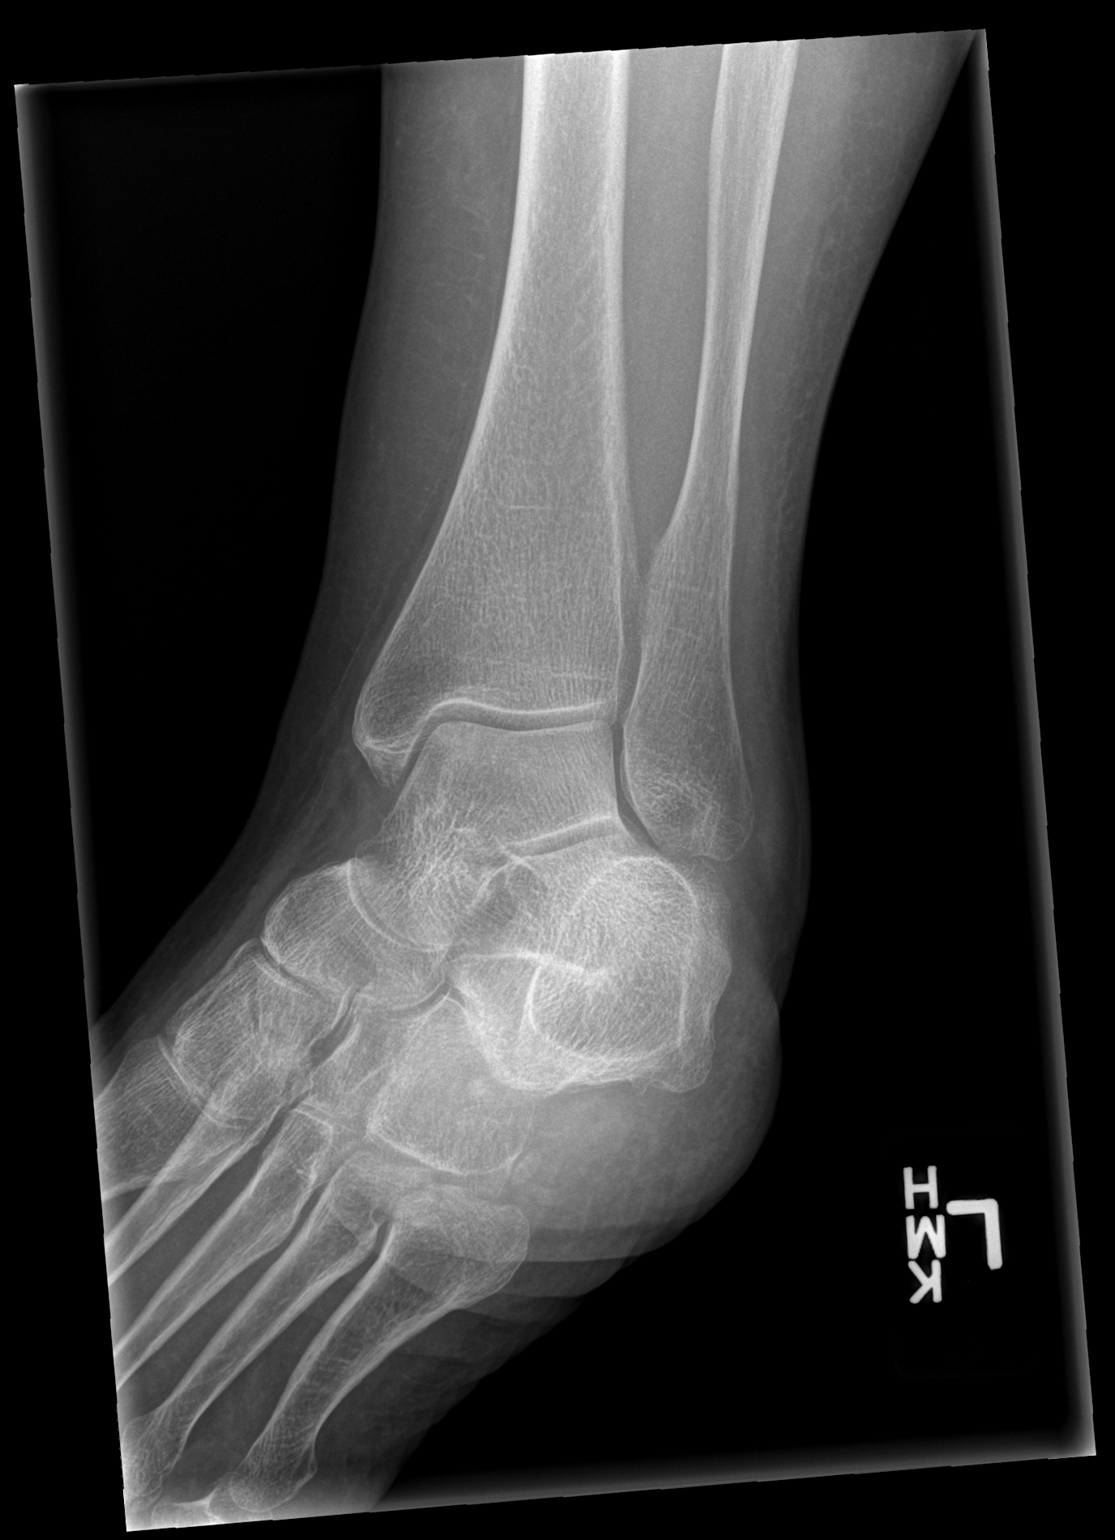

[x ankle ap left]
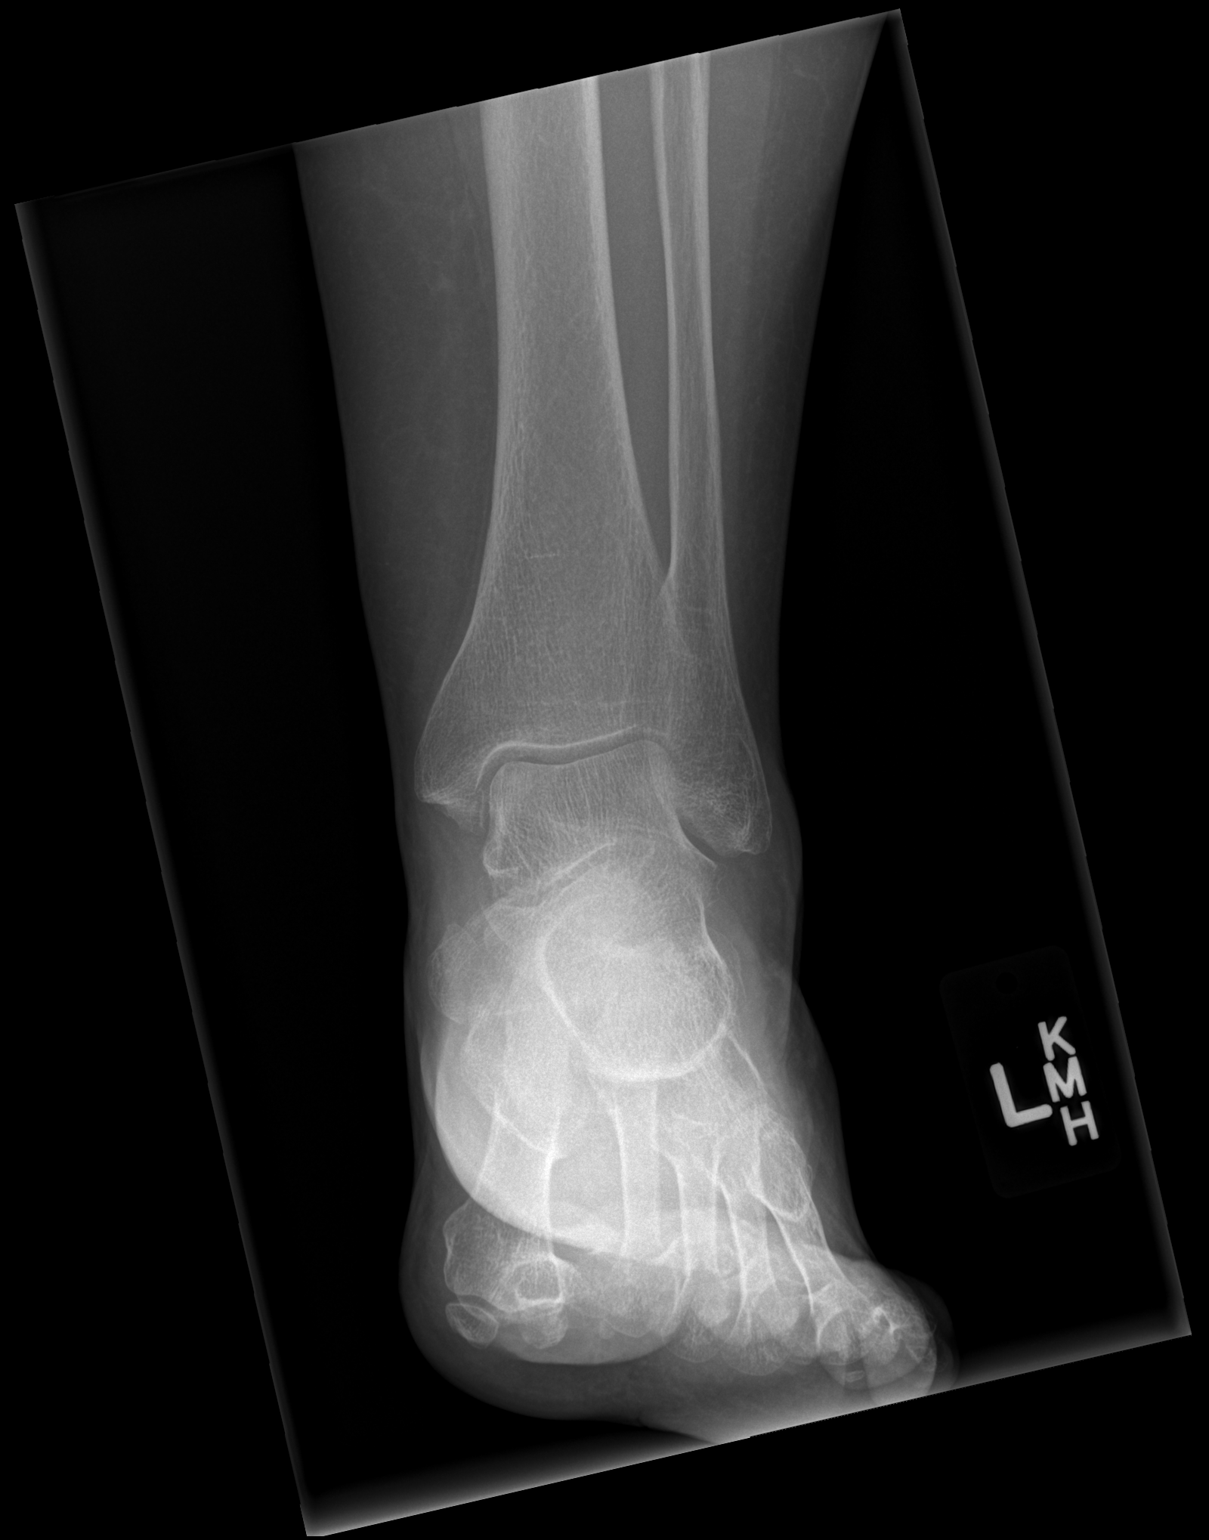

[x ankle lat left]
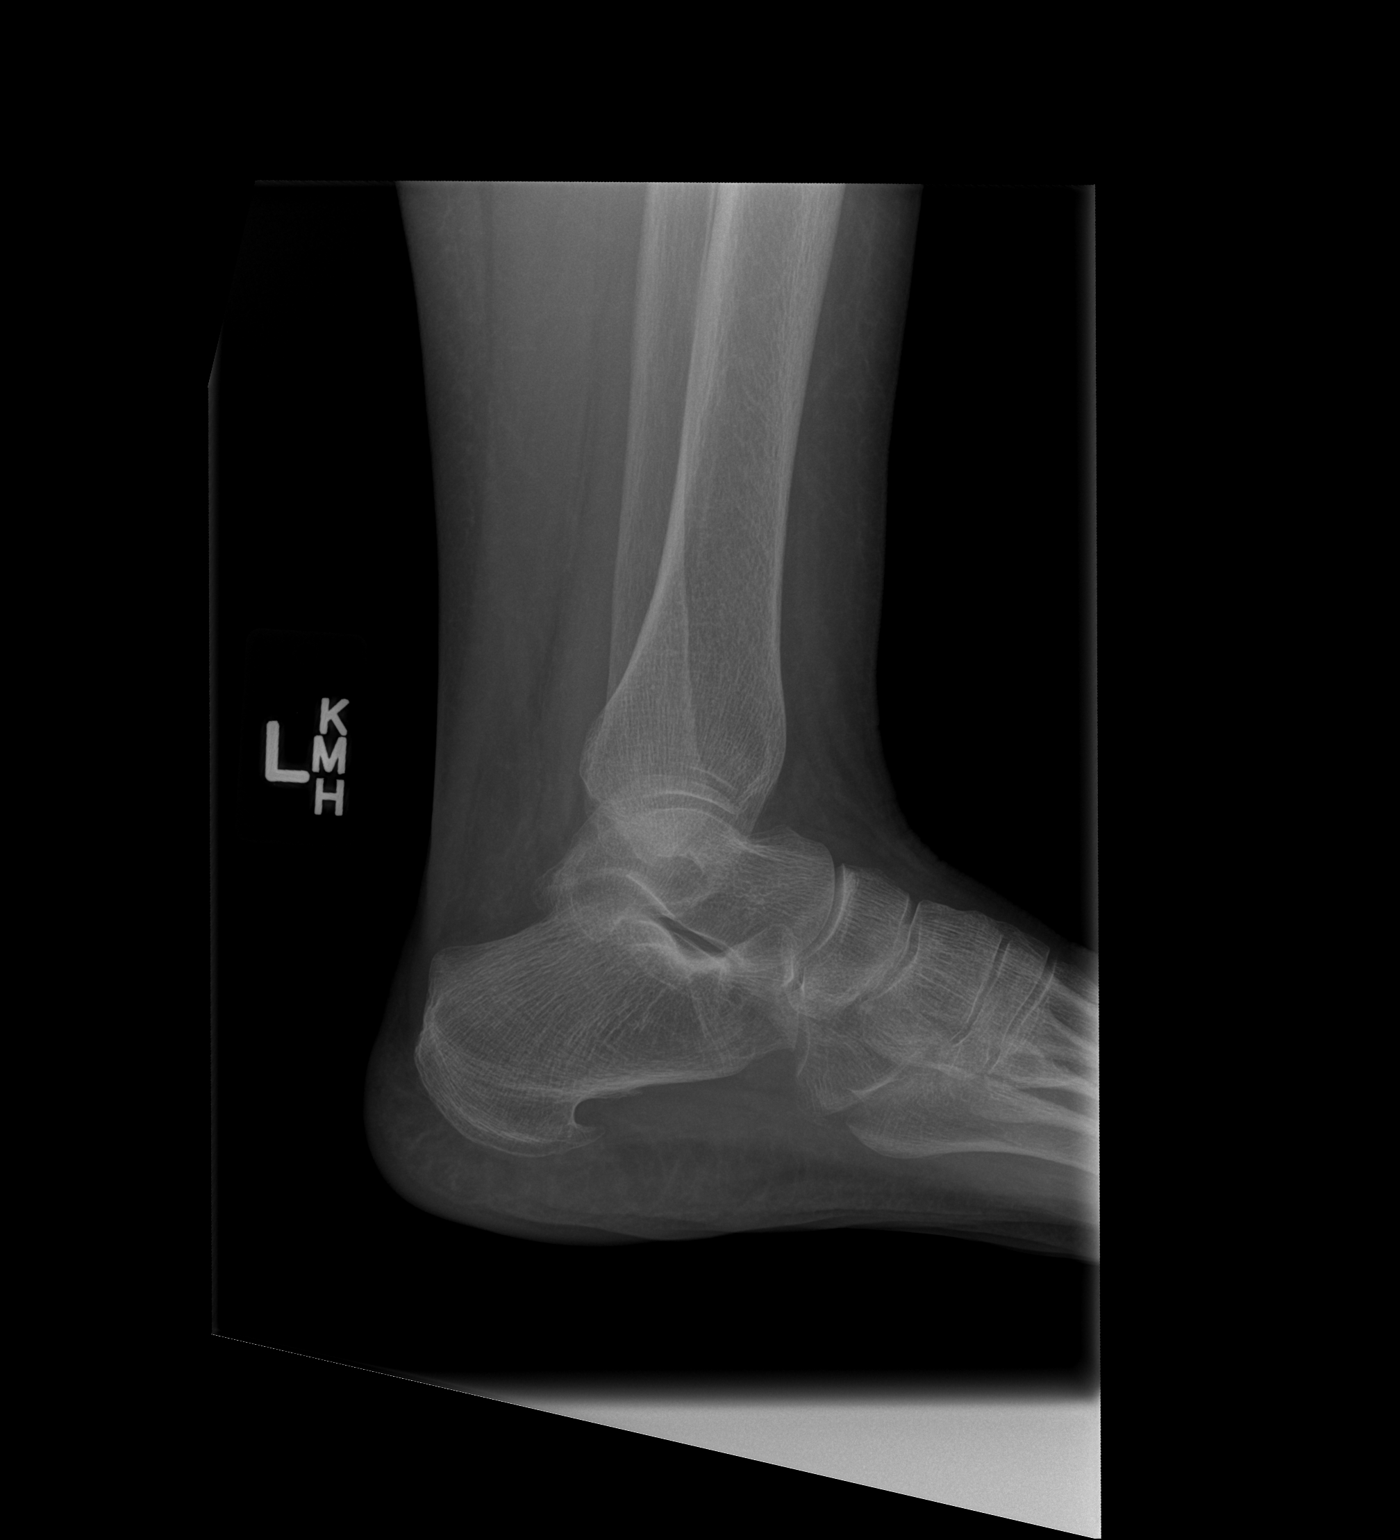

[3 of 3 positions shown; findings below may reference images not displayed]

FINDINGS: Osseous demineralization.
Mild lateral soft tissue swelling.
Ankle mortise intact.
Moderate sized plantar calcaneal spur.
No acute fracture, dislocation, or bone destruction.
IMPRESSION: Osseous mineralization.
No definite acute bony abnormalities.

## 2012-07-13 IMAGING — US US PELVIS COMPLETE
1 series · 13 of 25 positions shown · non-contrast
Comparison: CT on 12/25/2010

CLINICAL DATA: Follow-up left ovarian cyst seen on previous CT.
Post menopausal female.

TRANSABDOMINAL AND TRANSVAGINAL ULTRASOUND OF PELVIS
TECHNIQUE: Both transabdominal and transvaginal ultrasound
examinations of the pelvis were performed.  Transabdominal
technique was performed for global imaging of the pelvis including
uterus, ovaries, adnexal regions, and pelvic cul-de-sac.
It was necessary to proceed with endovaginal exam following the
transabdominal exam to visualize the vaginal cuff, ovaries, and
left adnexal cystic lesion.

[Series 1: us pelvis complete · 40 acquisitions, 13 frames shown]
[im 1/40]
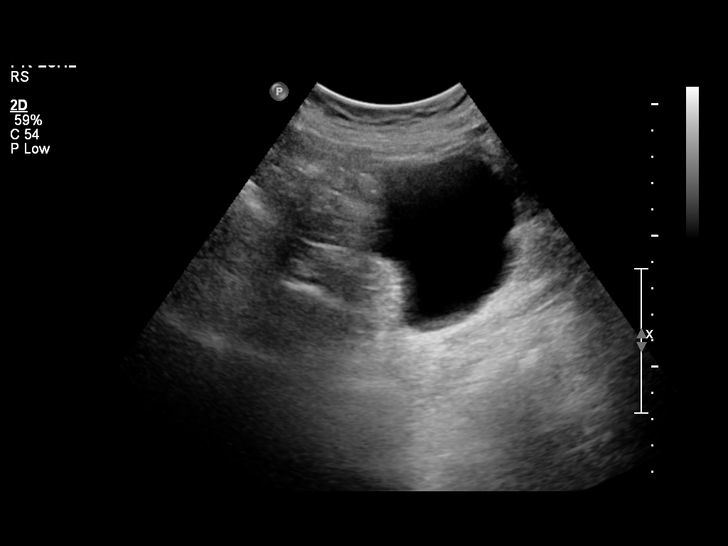
[im 4/40]
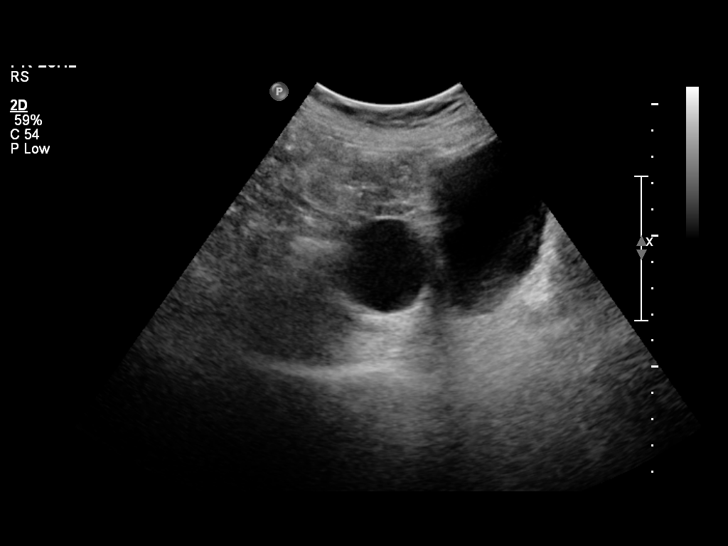
[im 7/40]
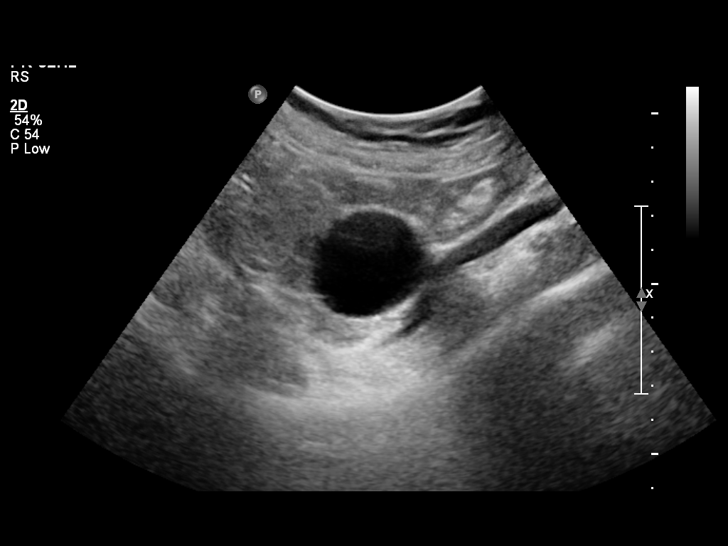
[im 10/40]
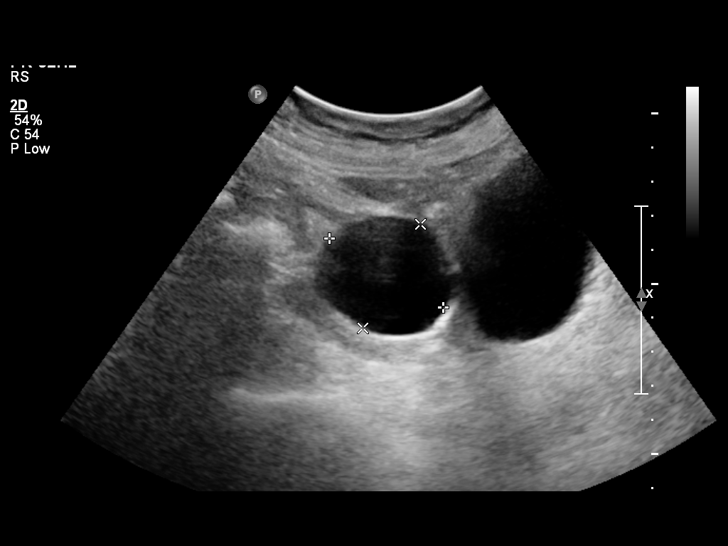
[im 14/40]
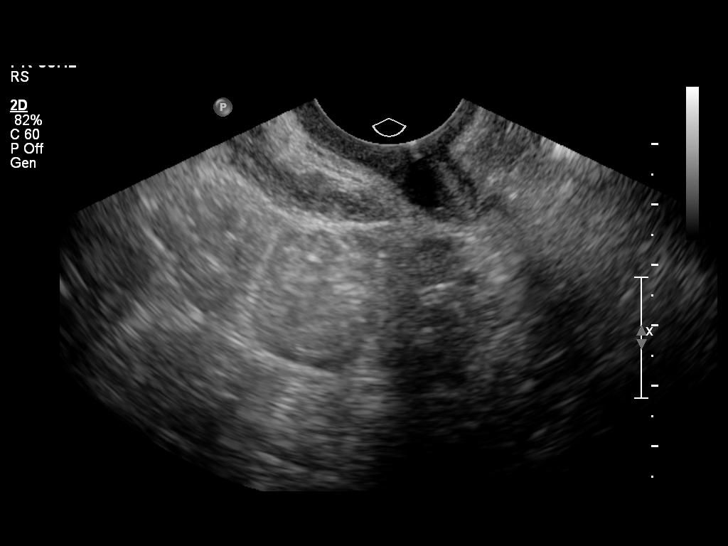
[im 17/40]
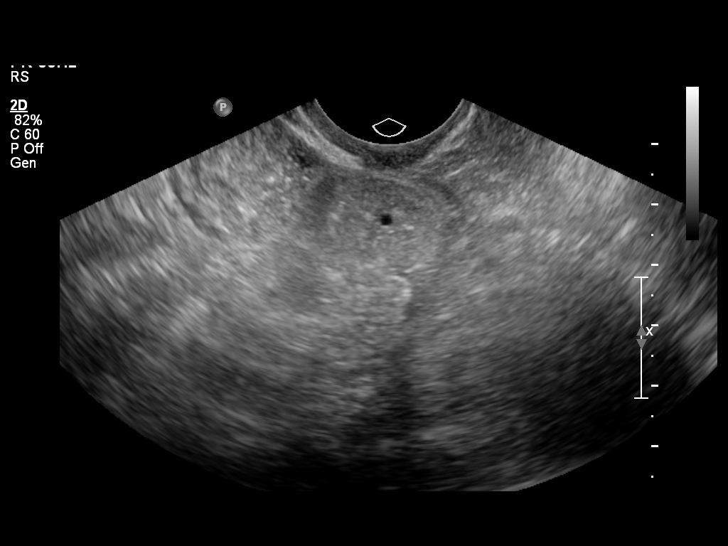
[im 20/40]
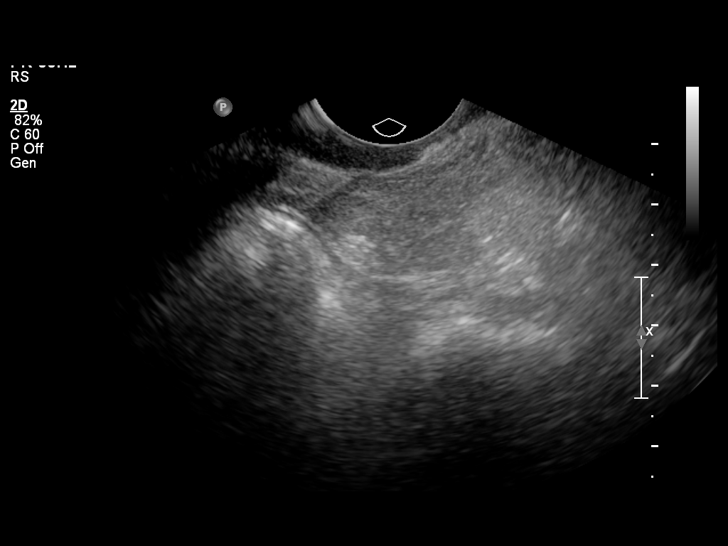
[im 23/40]
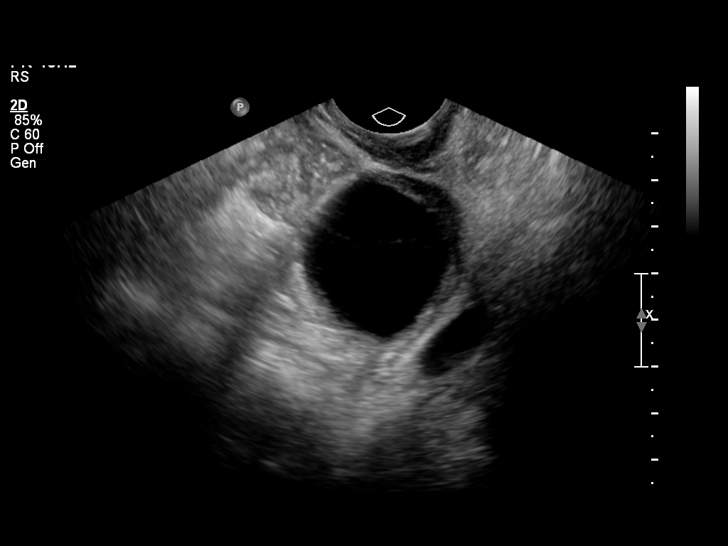
[im 27/40]
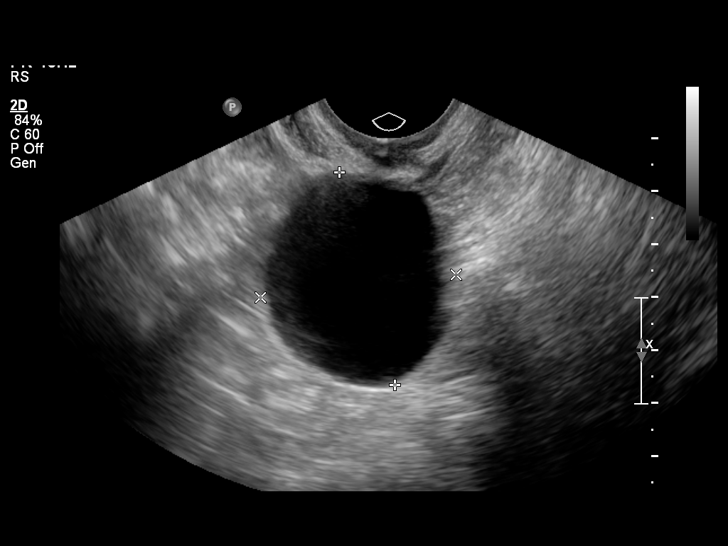
[im 30/40]
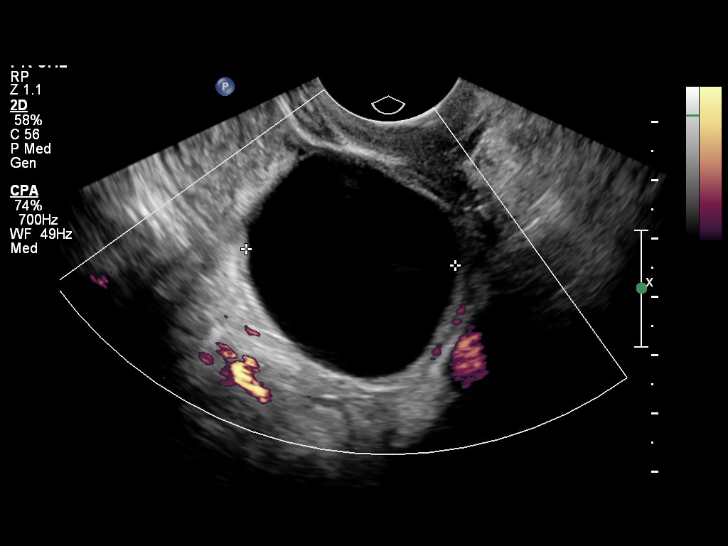
[im 33/40]
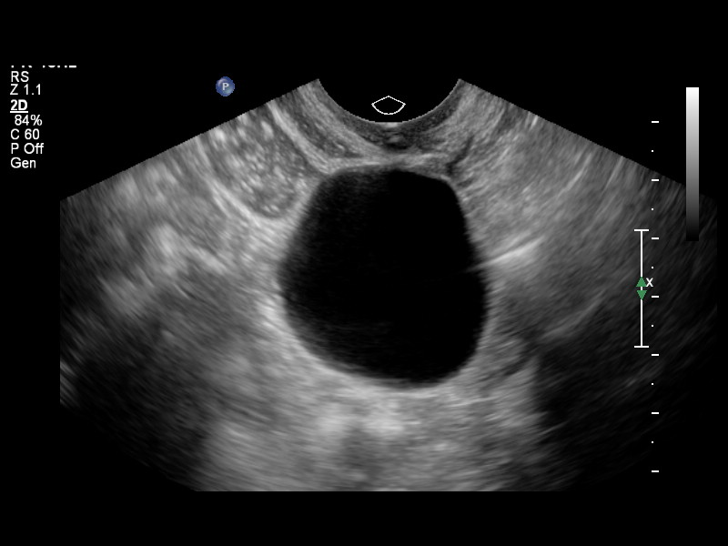
[im 36/40]
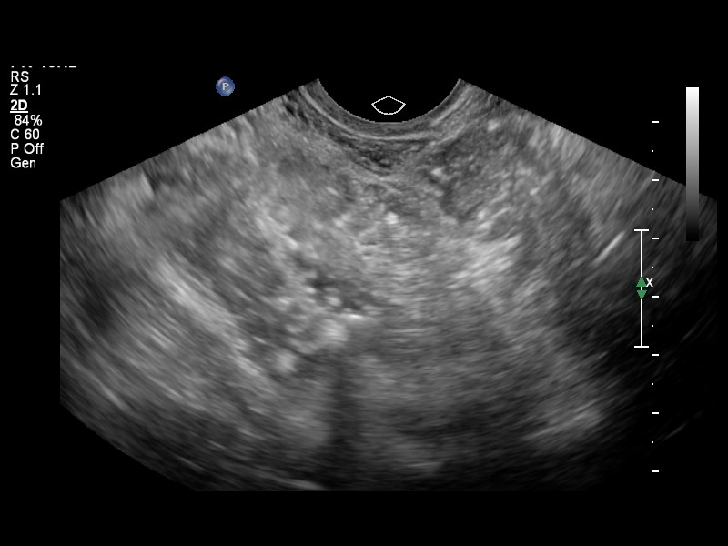
[im 40/40]
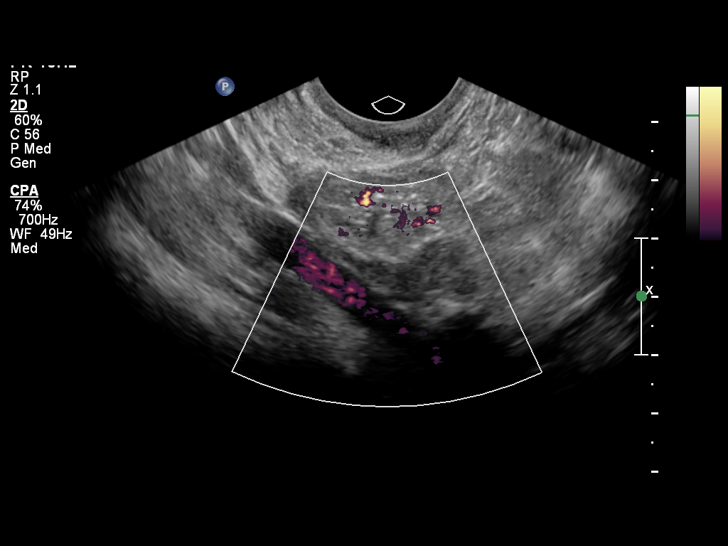

[13 of 25 positions shown; findings below may reference images not displayed]

FINDINGS: Uterus:  Surgically absent.  Vaginal cuff is unremarkable in
appearance.

Right ovary: 2.4 x 0.9 x 1.4 cm. Normal appearance.

Left ovary: 4.2 x 3.7 x 3.7 cm.  A simple cyst is seen which
measures 3.9 cm in maximum diameter.  This has benign
characteristics, and is stable since prior CT on 12/25/2010.

Other Findings:  No free fluid
IMPRESSION: 1.  3.9 cm simple left ovarian cyst with benign characteristics.
This has been stable since prior CT.  Annual follow-up by
ultrasound is recommended in a post menopausal female.
2.  Normal postmenopausal right ovary.  Previous hysterectomy.

This recommendation follows the consensus statement:  Management of
Asymptomatic Ovarian and Other Adnexal Cysts Imaged at US:  Society
of Radiologists in Ultrasound Consensus Conference Statement.
[URL]

## 2012-07-19 ENCOUNTER — Telehealth: Payer: Self-pay

## 2012-07-19 NOTE — Telephone Encounter (Signed)
FYI  A fax for refill on her pantoprazole was received today. The fax came from a pharmacy in [place]. I left a message on patient's voice mail to call back and schedule an appointment if she has not moved to [place].

## 2012-08-29 ENCOUNTER — Encounter: Payer: Self-pay | Admitting: Family Medicine

## 2013-06-24 ENCOUNTER — Emergency Department (INDEPENDENT_AMBULATORY_CARE_PROVIDER_SITE_OTHER)
Admission: EM | Admit: 2013-06-24 | Discharge: 2013-06-24 | Disposition: A | Discharge status: Home / Self Care | Payer: Medicare (Managed Care) | Source: Home / Self Care | Attending: Family Medicine | Admitting: Family Medicine

## 2013-06-24 ENCOUNTER — Encounter (HOSPITAL_COMMUNITY): Payer: Self-pay | Admitting: Emergency Medicine

## 2013-06-24 DIAGNOSIS — F319 Bipolar disorder, unspecified: Secondary | ICD-10-CM

## 2013-06-24 LAB — POCT I-STAT, CHEM 8
BUN: 9 mg/dL (ref 6–23)
Calcium, Ion: 1.21 mmol/L (ref 1.12–1.23)
Chloride: 103 mEq/L (ref 96–112)
Creatinine, Ser: 0.8 mg/dL (ref 0.50–1.10)
HCT: 41 % (ref 36.0–46.0)
TCO2: 27 mmol/L (ref 0–100)

## 2013-06-24 LAB — COMPREHENSIVE METABOLIC PANEL
AST: 13 U/L (ref 0–37)
Albumin: 3.7 g/dL (ref 3.5–5.2)
Alkaline Phosphatase: 109 U/L (ref 39–117)
BUN: 10 mg/dL (ref 6–23)
CO2: 27 mEq/L (ref 19–32)
Chloride: 103 mEq/L (ref 96–112)
GFR calc non Af Amer: >90 mL/min (ref >90)
Potassium: 4 mEq/L (ref 3.5–5.1)
Sodium: 141 mEq/L (ref 135–145)
Total Bilirubin: 0.2 mg/dL — ABNORMAL LOW (ref 0.3–1.2)
Total Protein: 6.9 g/dL (ref 6.0–8.3)

## 2013-06-24 LAB — POCT URINALYSIS DIP (DEVICE)
Hgb urine dipstick: NEGATIVE
Protein, ur: NEGATIVE mg/dL
Specific Gravity, Urine: 1.02 (ref 1.005–1.030)
Urobilinogen, UA: 0.2 mg/dL (ref 0.0–1.0)

## 2013-06-24 MED ORDER — MONTELUKAST SODIUM 10 MG PO TABS: 10.0000 mg | ORAL_TABLET | Freq: Every day | ORAL | Status: DC

## 2013-06-24 MED ORDER — PHENYTOIN SODIUM EXTENDED 100 MG PO CAPS: 100.0000 mg | ORAL_CAPSULE | Freq: Three times a day (TID) | ORAL | Status: DC

## 2013-06-24 MED ORDER — TRAZODONE HCL 150 MG PO TABS: ORAL_TABLET | ORAL | Status: AC

## 2013-06-24 MED ORDER — RISPERIDONE 2 MG PO TABS: 2.0000 mg | ORAL_TABLET | Freq: Every day | ORAL | Status: AC

## 2013-06-24 MED ORDER — INFLUENZA VAC SPLIT QUAD 0.5 ML IM SUSP
0.5000 mL | Freq: Once | INTRAMUSCULAR | Status: AC
  Administered 2013-06-24: 0.5 mL via INTRAMUSCULAR

## 2013-06-24 MED ORDER — SIMVASTATIN 20 MG PO TABS: 20.0000 mg | ORAL_TABLET | Freq: Every evening | ORAL | Status: DC

## 2013-06-24 MED ORDER — INFLUENZA VAC SPLIT QUAD 0.5 ML IM SUSP: 0.5000 mL | INTRAMUSCULAR | Status: DC

## 2013-06-24 MED ORDER — PANTOPRAZOLE SODIUM 40 MG PO TBEC: 40.0000 mg | DELAYED_RELEASE_TABLET | Freq: Every day | ORAL | Status: DC

## 2013-06-24 MED ORDER — FLUTICASONE PROPIONATE 50 MCG/ACT NA SUSP: 2.0000 | Freq: Every day | NASAL | Status: DC

## 2013-06-24 NOTE — ED Provider Notes (Signed)
Katherine Beck is a 57 y.o. female who presents to Urgent Care today for medication refill.  Patient has multiple medical problems most prominent or seizure disorder and bipolar. She recently moved back to [place] after living in [place]. She is currently seeing a psychologist who has prescribed most of her psychiatric medications. However she does not have her primary care provider. She is running out of her Dilantin, Protonix, simvastatin, Naproxen, Singulair, and Risperdal. She would like a refill of possible. She notes some anxiety and reflux symptoms. She has been reducing the amount of Dilantin recently as she is fearful of running out completely. She is now taking it only daily in an effort to extend the medication.  She does not yet have an established primary care provider in the area. She denies any chest pains palpitations trouble breathing nausea vomiting or diarrhea.    Past Medical History  Diagnosis Date  . COPD (chronic obstructive pulmonary disease)   . Hypertension   . GERD (gastroesophageal reflux disease)   . Vertigo   . Epileptic seizures     Iago Mal last seizure 2 years ago  . Sleep apnea, obstructive   . Restless leg syndrome   . Addiction, marijuana   . Osteoarthritis (arthritis due to wear and tear of joints)   . Migraines   . Obesity   . Seizures   . Depression   . Asthma   . Anxiety    History  Substance Use Topics  . Smoking status: Current Every Day Smoker -- 0.50 packs/day for 10 years    Types: Cigarettes  . Smokeless tobacco: Never Used  . Alcohol Use: No   ROS as above Medications reviewed. No current facility-administered medications for this encounter.   Current Outpatient Prescriptions  Medication Sig Dispense Refill  . escitalopram (LEXAPRO) 20 MG tablet Take 1 tablet (20 mg total) by mouth daily.  30 tablet  1  . Fluticasone-Salmeterol (ADVAIR DISKUS) 500-50 MCG/DOSE AEPB Inhale 1 puff into the lungs every 12 (twelve) hours.  60 each   11  . meclizine (ANTIVERT) 25 MG tablet Take 1 tablet (25 mg total) by mouth 3 (three) times daily as needed.  90 tablet  11  . SUMAtriptan (IMITREX) 100 MG tablet Take 1 tablet (100 mg total) by mouth once as needed for migraine.  10 tablet  6  . tiotropium (SPIRIVA HANDIHALER) 18 MCG inhalation capsule Place 1 capsule (18 mcg total) into inhaler and inhale daily.  30 capsule  11  . traMADol (ULTRAM) 50 MG tablet Take 1 tablet (50 mg total) by mouth every 8 (eight) hours as needed. Maximum dose= 8 tablets per day  60 tablet  6  . fluticasone (FLONASE) 50 MCG/ACT nasal spray Place 2 sprays into the nose daily.  16 g  2  . ibuprofen (ADVIL,MOTRIN) 600 MG tablet Take 600 mg by mouth every 8 (eight) hours as needed.        . montelukast (SINGULAIR) 10 MG tablet Take 1 tablet (10 mg total) by mouth at bedtime.  30 tablet  1  . pantoprazole (PROTONIX) 40 MG tablet Take 1 tablet (40 mg total) by mouth daily.  30 tablet  2  . phenytoin (DILANTIN) 100 MG ER capsule Take 1 capsule (100 mg total) by mouth 3 (three) times daily.  90 capsule  0  . risperiDONE (RISPERDAL) 2 MG tablet Take 1 tablet (2 mg total) by mouth daily.  30 tablet  0  . simvastatin (ZOCOR) 20 MG  tablet Take 1 tablet (20 mg total) by mouth every evening.  30 tablet  0  . traZODone (DESYREL) 150 MG tablet Take one tablet daily for insomnia.  30 tablet  0  . [DISCONTINUED] naproxen sodium (ANAPROX) 550 MG tablet Take 1 tablet (550 mg total) by mouth 2 (two) times daily with a meal.  60 tablet  11    Exam:  BP 148/70  Pulse 79  Temp(Src) 98.5 F (36.9 C) (Oral)  Resp 18  SpO2 96% Gen: Well NAD, nontoxic appearing HEENT: EOMI,  MMM Lungs: CTABL Nl WOB Heart: RRR no MRG Abd: NABS, NT, ND Exts: Non edematous BL  LE, warm and well perfused.  Psych: Alert and oriented normally conversant normal affect. Speech is normal appearing thought process is linear and goal-directed. No SI/HI  Results for orders placed during the hospital  encounter of 06/24/13 (from the past 24 hour(s))  POCT URINALYSIS DIP (DEVICE)     Status: None   Collection Time    06/24/13  3:40 PM      Result Value Range   Glucose, UA NEGATIVE  NEGATIVE mg/dL   Bilirubin Urine NEGATIVE  NEGATIVE   Ketones, ur NEGATIVE  NEGATIVE mg/dL   Specific Gravity, Urine 1.020  1.005 - 1.030   Hgb urine dipstick NEGATIVE  NEGATIVE   pH 7.0  5.0 - 8.0   Protein, ur NEGATIVE  NEGATIVE mg/dL   Urobilinogen, UA 0.2  0.0 - 1.0 mg/dL   Nitrite NEGATIVE  NEGATIVE   Leukocytes, UA NEGATIVE  NEGATIVE  COMPREHENSIVE METABOLIC PANEL     Status: Abnormal   Collection Time    06/24/13  4:30 PM      Result Value Range   Sodium 141  135 - 145 mEq/L   Potassium 4.0  3.5 - 5.1 mEq/L   Chloride 103  96 - 112 mEq/L   CO2 27  19 - 32 mEq/L   Glucose, Bld 74  70 - 99 mg/dL   BUN 10  6 - 23 mg/dL   Creatinine, Ser 4.09  0.50 - 1.10 mg/dL   Calcium 9.1  8.4 - 81.1 mg/dL   Total Protein 6.9  6.0 - 8.3 g/dL   Albumin 3.7  3.5 - 5.2 g/dL   AST 13  0 - 37 U/L   ALT 6  0 - 35 U/L   Alkaline Phosphatase 109  39 - 117 U/L   Total Bilirubin 0.2 (*) 0.3 - 1.2 mg/dL   GFR calc non Af Amer >90  >90 mL/min   GFR calc Af Amer >90  >90 mL/min  PHENYTOIN LEVEL, TOTAL     Status: Abnormal   Collection Time    06/24/13  4:30 PM      Result Value Range   Phenytoin Lvl 3.2 (*) 10.0 - 20.0 ug/mL  POCT I-STAT, CHEM 8     Status: None   Collection Time    06/24/13  4:42 PM      Result Value Range   Sodium 139  135 - 145 mEq/L   Potassium 4.1  3.5 - 5.1 mEq/L   Chloride 103  96 - 112 mEq/L   BUN 9  6 - 23 mg/dL   Creatinine, Ser 9.14  0.50 - 1.10 mg/dL   Glucose, Bld 74  70 - 99 mg/dL   Calcium, Ion 7.82  9.56 - 1.23 mmol/L   TCO2 27  0 - 100 mmol/L   Hemoglobin 13.9  12.0 - 15.0 g/dL  HCT 41.0  36.0 - 46.0 %   No results found.  Assessment and Plan: 57 y.o. female with  Bipolar, seizure disorder, GERD who is running out of medications. Was predominantly is her low  Dilantin. She has been using Dilantin daily instead of 3 times a day in an effort to extended as she is running out of the medications. Her Dilantin level is correspondingly low. Plan to refill Dilantin. Additionally will refill for Restoril as well. Additionally her reflux medication has expired I will refill this as well. Additionally she needs some COPD medications which will be refilled.  I have referred patient to the [facility] for further evaluation and management. She should have repeat labs in about 2 or 3 weeks.  Discussed warning signs or symptoms. Please see discharge instructions. Patient expresses understanding.      Rodolph Bong, MD 06/24/13 Rickey Primus

## 2013-06-24 NOTE — ED Notes (Signed)
Reports recently moving to [place]; has been running out of her meds.  Has had to cut down Dilantin dose to once daily (instead of tid), also ran out of Protonix - has been using Nexium without relief.  States abd pain feels like her typical GERD.  Also c/o feeling anxious and shaky - no longer has any Xanax.  Pt either out of or running very low on: Dilantin, Protonix, simvastatin, Naproxen, Singulair, and Risperdal.

## 2013-08-01 ENCOUNTER — Ambulatory Visit: Payer: Medicare (Managed Care) | Attending: Internal Medicine | Admitting: Internal Medicine

## 2013-08-01 ENCOUNTER — Encounter: Payer: Self-pay | Admitting: Internal Medicine

## 2013-08-01 VITALS — BP 142/77 | HR 69 | Temp 99.5°F | Resp 16 | Ht 61.0 in | Wt 193.0 lb

## 2013-08-01 DIAGNOSIS — F17200 Nicotine dependence, unspecified, uncomplicated: Secondary | ICD-10-CM | POA: Insufficient documentation

## 2013-08-01 DIAGNOSIS — J449 Chronic obstructive pulmonary disease, unspecified: Secondary | ICD-10-CM

## 2013-08-01 DIAGNOSIS — R42 Dizziness and giddiness: Secondary | ICD-10-CM

## 2013-08-01 DIAGNOSIS — H8110 Benign paroxysmal vertigo, unspecified ear: Secondary | ICD-10-CM | POA: Insufficient documentation

## 2013-08-01 DIAGNOSIS — R569 Unspecified convulsions: Secondary | ICD-10-CM | POA: Insufficient documentation

## 2013-08-01 DIAGNOSIS — E785 Hyperlipidemia, unspecified: Secondary | ICD-10-CM

## 2013-08-01 DIAGNOSIS — J45909 Unspecified asthma, uncomplicated: Secondary | ICD-10-CM | POA: Diagnosis present

## 2013-08-01 DIAGNOSIS — F3132 Bipolar disorder, current episode depressed, moderate: Secondary | ICD-10-CM

## 2013-08-01 DIAGNOSIS — F1220 Cannabis dependence, uncomplicated: Secondary | ICD-10-CM

## 2013-08-01 MED ORDER — FLUTICASONE PROPIONATE 50 MCG/ACT NA SUSP: 2.0000 | Freq: Every day | NASAL | Status: AC

## 2013-08-01 MED ORDER — MECLIZINE HCL 25 MG PO TABS: 25.0000 mg | ORAL_TABLET | Freq: Three times a day (TID) | ORAL | Status: AC | PRN

## 2013-08-01 MED ORDER — SIMVASTATIN 20 MG PO TABS: 20.0000 mg | ORAL_TABLET | Freq: Every evening | ORAL | Status: AC

## 2013-08-01 MED ORDER — PANTOPRAZOLE SODIUM 40 MG PO TBEC: 40.0000 mg | DELAYED_RELEASE_TABLET | Freq: Every day | ORAL | Status: AC

## 2013-08-01 MED ORDER — MONTELUKAST SODIUM 10 MG PO TABS: 10.0000 mg | ORAL_TABLET | Freq: Every day | ORAL | Status: AC

## 2013-08-01 MED ORDER — TRAMADOL HCL 50 MG PO TABS: 50.0000 mg | ORAL_TABLET | Freq: Three times a day (TID) | ORAL | Status: AC | PRN

## 2013-08-01 MED ORDER — ALBUTEROL SULFATE HFA 108 (90 BASE) MCG/ACT IN AERS: 2.0000 | INHALATION_SPRAY | Freq: Four times a day (QID) | RESPIRATORY_TRACT | Status: AC | PRN

## 2013-08-01 MED ORDER — FLUTICASONE-SALMETEROL 500-50 MCG/DOSE IN AEPB: 1.0000 | INHALATION_SPRAY | Freq: Two times a day (BID) | RESPIRATORY_TRACT | Status: AC

## 2013-08-01 MED ORDER — TIOTROPIUM BROMIDE MONOHYDRATE 18 MCG IN CAPS: 18.0000 ug | ORAL_CAPSULE | Freq: Every day | RESPIRATORY_TRACT | Status: AC

## 2013-08-01 MED ORDER — PHENYTOIN SODIUM EXTENDED 100 MG PO CAPS: 100.0000 mg | ORAL_CAPSULE | Freq: Three times a day (TID) | ORAL | Status: AC

## 2013-08-01 NOTE — Progress Notes (Signed)
Patient ID: Katherine Beck, female   DOB: [birth year], 57 y.o.   MRN: [id number] Patient Demographics  Katherine Beck, is a 57 y.o. female  [id number]:[id number]  [id number]  DOB - 1955/10/29  Chief Complaint  Patient presents with  . Establish Care        Subjective:   Katherine Beck today is here to establish primary care. Patient is a 57 year old female with history of bipolar disorder, anxiety, nicotine abuse, substance abuse with marijuana, seizures, hyperlipidemia, asthma presented to the clinic for establishing care. Patient reports that she used to live in [place] and relocated here. Otherwise she has no complaints. Patient has No headache, No chest pain, No abdominal pain - No Nausea, No new weakness tingling or numbness, No Cough - SOB.   Objective:    Filed Vitals:   08/01/13 0908  BP: 142/77  Pulse: 69  Temp: 99.5 F (37.5 C)  TempSrc: Oral  Resp: 16  Height: 5\' 1"  (1.549 m)  Weight: 193 lb (87.544 kg)  SpO2: 95%     ALLERGIES:   Allergies  Allergen Reactions  . Sulfa Antibiotics     PAST MEDICAL HISTORY: Past Medical History  Diagnosis Date  . COPD (chronic obstructive pulmonary disease)   . Hypertension   . GERD (gastroesophageal reflux disease)   . Vertigo   . Epileptic seizures     [place] last seizure 2 years ago  . Sleep apnea, obstructive   . Restless leg syndrome   . Addiction, marijuana   . Osteoarthritis (arthritis due to wear and tear of joints)   . Migraines   . Obesity   . Seizures   . Depression   . Asthma   . Anxiety     PAST SURGICAL HISTORY: Past Surgical History  Procedure Laterality Date  . Partial hysterectomy      Ovaries intact  . Bladder tack    . Roux-en-y procedure  2004    [place]  . Ganglion cyst left wrist    . Foot surgeries x2      FAMILY HISTORY: Family History  Problem Relation Age of Onset  . Hypertension Mother   . Depression Mother   . Diabetes Mother   . Arthritis Mother   . Thyroid disease  Mother   . Heart disease Father   . Asthma Sister   . Depression Brother   . Heart disease Brother   . Depression Son     MEDICATIONS AT HOME: Prior to Admission medications   Medication Sig Start Date End Date Taking? Authorizing Provider  escitalopram (LEXAPRO) 20 MG tablet Take 1 tablet (20 mg total) by mouth daily. 04/06/12  Yes Larena Sox, MD  fluticasone (FLONASE) 50 MCG/ACT nasal spray Place 2 sprays into both nostrils daily. 08/01/13 08/01/14 Yes Annagrace Carr Jenna Luo, MD  Fluticasone-Salmeterol (ADVAIR DISKUS) 500-50 MCG/DOSE AEPB Inhale 1 puff into the lungs every 12 (twelve) hours. 08/01/13  Yes Udell Mazzocco Jenna Luo, MD  meclizine (ANTIVERT) 25 MG tablet Take 1 tablet (25 mg total) by mouth 3 (three) times daily as needed. 08/01/13  Yes Cashlynn Yearwood Jenna Luo, MD  montelukast (SINGULAIR) 10 MG tablet Take 1 tablet (10 mg total) by mouth at bedtime. 08/01/13 08/01/14 Yes Naquisha Whitehair Jenna Luo, MD  pantoprazole (PROTONIX) 40 MG tablet Take 1 tablet (40 mg total) by mouth daily. 08/01/13  Yes Kyona Chauncey Jenna Luo, MD  phenytoin (DILANTIN) 100 MG ER capsule Take 1 capsule (100 mg total) by mouth 3 (three) times daily. 08/01/13  Yes Cambri Plourde  Jenna Luo, MD  risperiDONE (RISPERDAL) 2 MG tablet Take 1 tablet (2 mg total) by mouth daily. 06/24/13  Yes Rodolph Bong, MD  simvastatin (ZOCOR) 20 MG tablet Take 1 tablet (20 mg total) by mouth every evening. 08/01/13  Yes Kindall Swaby Jenna Luo, MD  traMADol (ULTRAM) 50 MG tablet Take 1 tablet (50 mg total) by mouth every 8 (eight) hours as needed for moderate pain. Maximum dose= 8 tablets per day 08/01/13  Yes Emanuelle Bastos Jenna Luo, MD  traZODone (DESYREL) 150 MG tablet Take one tablet daily for insomnia. 06/24/13  Yes Rodolph Bong, MD  albuterol (PROVENTIL HFA;VENTOLIN HFA) 108 (90 BASE) MCG/ACT inhaler Inhale 2 puffs into the lungs every 6 (six) hours as needed for wheezing or shortness of breath. 08/01/13   Janica Eldred Jenna Luo, MD  ibuprofen (ADVIL,MOTRIN) 600 MG tablet Take 600 mg by mouth every 8 (eight)  hours as needed.      Historical Provider, MD  SUMAtriptan (IMITREX) 100 MG tablet Take 1 tablet (100 mg total) by mouth once as needed for migraine. 12/21/11 06/24/13  Hassan Rowan, MD  tiotropium (SPIRIVA HANDIHALER) 18 MCG inhalation capsule Place 1 capsule (18 mcg total) into inhaler and inhale daily. 02/22/12 06/24/13  Hassan Rowan, MD    REVIEW OF SYSTEMS:  Constitutional:   No   Fevers, chills, fatigue.  HEENT:    No headaches, Sore throat,   Cardio-vascular: No chest pain,  Orthopnea, swelling in lower extremities, anasarca, palpitations  GI:  No abdominal pain, nausea, vomiting, diarrhea  Resp: No shortness of breath,  No coughing up of blood.No cough.No wheezing.  Skin:  no rash or lesions.  GU:  no dysuria, change in color of urine, no urgency or frequency.  No flank pain.  Musculoskeletal: No joint pain or swelling.  No decreased range of motion.  No back pain.  Psych: No change in mood or affect. No depression or anxiety.  No memory loss.   Exam  General appearance :Awake, alert, NAD, Speech Clear. HEENT: Atraumatic and Normocephalic, PERLA Neck: supple, no JVD. No cervical lymphadenopathy.  Chest: clear to auscultation bilaterally, no wheezing, rales or rhonchi CVS: S1 S2 regular, no murmurs.  Abdomen: soft, NBS, NT, ND, no gaurding, rigidity or rebound. Extremities: No cyanosis, clubbing, B/L Lower Ext shows no edema,  Neurology: Awake alert, and oriented X 3, CN II-XII intact, Non focal Skin:No Rash or lesions Wounds: N/A    Data Review   Basic Metabolic Panel: No results found for this basename: NA, K, CL, CO2, GLUCOSE, BUN, CREATININE, CALCIUM, MG, PHOS,  in the last 168 hours Liver Function Tests: No results found for this basename: AST, ALT, ALKPHOS, BILITOT, PROT, ALBUMIN,  in the last 168 hours  CBC: No results found for this basename: WBC, NEUTROABS, HGB, HCT, MCV, PLT,  in the last 168  hours ------------------------------------------------------------------------------------------------------------------ No results found for this basename: HGBA1C,  in the last 72 hours ------------------------------------------------------------------------------------------------------------------ No results found for this basename: CHOL, HDL, LDLCALC, TRIG, CHOLHDL, LDLDIRECT,  in the last 72 hours ------------------------------------------------------------------------------------------------------------------ No results found for this basename: TSH, T4TOTAL, FREET3, T3FREE, THYROIDAB,  in the last 72 hours ------------------------------------------------------------------------------------------------------------------ No results found for this basename: VITAMINB12, FOLATE, FERRITIN, TIBC, IRON, RETICCTPCT,  in the last 72 hours  Coagulation profile  No results found for this basename: INR, PROTIME,  in the last 168 hours    Assessment & Plan   Active Problems: Patient Active Problem List   Diagnosis Date Noted  . Seizures - Continue  Dilantin   08/01/2013  . Nicotine dependence -Patient was counseled strongly to quit smoking    08/01/2013  . Benign paroxysmal positional vertigo - Continue meclizine as needed   08/01/2013  . Marijuana dependence - Patient was strongly counseled to quit marijuana, she states that she is using it following anxiety. Patient has started going to [facility], hopefully this will help.    08/01/2013  . Other and unspecified hyperlipidemia - Continue Zocor, check lipid panel at the next visit   08/01/2013  . Bipolar I disorder, most recent episode (or current) depressed, moderate - Continue trazodone, risperidone, Lexapro    12/15/2011   Asthma: Continue Flonase, Advair Diskus, albuterol inhaler as needed.  She's is also on Spiriva, added    health maintenance  - Patient states she has received flu shot this year  - Mammogram was done last  year which was negative, will order mammogram for 2015 - She had complete hysterectomy, states that she doesn't have Pap smears   Recommendations: obtain labs next visit  Follow-up in 4 months    Taeshaun Rames M.D. 08/01/2013, 9:30 AM

## 2013-08-01 NOTE — Progress Notes (Signed)
Pt is here to establish care. 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

## 2013-09-19 ENCOUNTER — Telehealth: Payer: Self-pay | Admitting: Internal Medicine

## 2013-09-19 NOTE — Telephone Encounter (Signed)
Pt called regarding a refill of her medication, please contact pt °

## 2013-09-20 ENCOUNTER — Telehealth: Payer: Self-pay | Admitting: Emergency Medicine

## 2013-09-20 NOTE — Telephone Encounter (Signed)
Attempted to leave message but no answer

## 2013-09-20 NOTE — Telephone Encounter (Signed)
Attempted to reach pt but no answer

## 2013-11-30 ENCOUNTER — Ambulatory Visit: Payer: Self-pay | Admitting: Internal Medicine

## 2014-07-01 ENCOUNTER — Encounter: Payer: Self-pay | Admitting: Internal Medicine
